# Patient Record
Sex: Male | Born: 1935 | Race: White | Hispanic: No | Marital: Married | Smoking: Never smoker
Health system: Southern US, Community
[De-identification: ages and names within clinical notes are randomized; demographics above are authoritative.]

## PROBLEM LIST (undated history)

## (undated) DIAGNOSIS — A498 Other bacterial infections of unspecified site: Secondary | ICD-10-CM

## (undated) DIAGNOSIS — E785 Hyperlipidemia, unspecified: Secondary | ICD-10-CM

## (undated) DIAGNOSIS — F419 Anxiety disorder, unspecified: Secondary | ICD-10-CM

## (undated) DIAGNOSIS — F32A Depression, unspecified: Secondary | ICD-10-CM

## (undated) DIAGNOSIS — R499 Unspecified voice and resonance disorder: Secondary | ICD-10-CM

## (undated) DIAGNOSIS — K219 Gastro-esophageal reflux disease without esophagitis: Secondary | ICD-10-CM

## (undated) DIAGNOSIS — I6529 Occlusion and stenosis of unspecified carotid artery: Secondary | ICD-10-CM

## (undated) DIAGNOSIS — E039 Hypothyroidism, unspecified: Secondary | ICD-10-CM

## (undated) DIAGNOSIS — N400 Enlarged prostate without lower urinary tract symptoms: Secondary | ICD-10-CM

## (undated) DIAGNOSIS — G4701 Insomnia due to medical condition: Principal | ICD-10-CM

## (undated) DIAGNOSIS — F329 Major depressive disorder, single episode, unspecified: Secondary | ICD-10-CM

## (undated) DIAGNOSIS — R630 Anorexia: Secondary | ICD-10-CM

## (undated) HISTORY — DX: Major depressive disorder, single episode, unspecified: F32.9

## (undated) HISTORY — DX: Anxiety disorder, unspecified: F41.9

## (undated) HISTORY — DX: Hyperlipidemia, unspecified: E78.5

## (undated) HISTORY — DX: Hypothyroidism, unspecified: E03.9

## (undated) HISTORY — DX: Insomnia due to medical condition: G47.01

## (undated) HISTORY — DX: Other bacterial infections of unspecified site: A49.8

## (undated) HISTORY — DX: Occlusion and stenosis of unspecified carotid artery: I65.29

## (undated) HISTORY — DX: Anorexia: R63.0

## (undated) HISTORY — DX: Unspecified voice and resonance disorder: R49.9

## (undated) HISTORY — DX: Benign prostatic hyperplasia without lower urinary tract symptoms: N40.0

## (undated) HISTORY — DX: Depression, unspecified: F32.A

## (undated) HISTORY — DX: Gastro-esophageal reflux disease without esophagitis: K21.9

---
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
## 2001-04-05 ENCOUNTER — Encounter: Admission: RE | Admit: 2001-04-05 | Discharge: 2001-04-05 | Payer: Self-pay | Admitting: Family Medicine

## 2001-04-05 ENCOUNTER — Encounter: Payer: Self-pay | Admitting: Family Medicine

## 2001-04-29 ENCOUNTER — Ambulatory Visit (HOSPITAL_COMMUNITY): Admission: RE | Admit: 2001-04-29 | Discharge: 2001-04-29 | Payer: Self-pay | Admitting: Gastroenterology

## 2006-07-14 HISTORY — PX: CATARACT EXTRACTION: SUR2

## 2006-07-14 HISTORY — PX: OTHER SURGICAL HISTORY: SHX169

## 2007-01-15 ENCOUNTER — Emergency Department (HOSPITAL_COMMUNITY): Admission: EM | Admit: 2007-01-15 | Discharge: 2007-01-15 | Payer: Self-pay | Admitting: Emergency Medicine

## 2007-07-15 HISTORY — PX: EYE SURGERY: SHX253

## 2007-12-25 ENCOUNTER — Emergency Department (HOSPITAL_COMMUNITY): Admission: EM | Admit: 2007-12-25 | Discharge: 2007-12-26 | Payer: Self-pay | Admitting: Emergency Medicine

## 2009-05-11 ENCOUNTER — Encounter: Admission: RE | Admit: 2009-05-11 | Discharge: 2009-05-11 | Payer: Self-pay | Admitting: Family Medicine

## 2009-07-14 HISTORY — PX: WISDOM TOOTH EXTRACTION: SHX21

## 2010-05-10 ENCOUNTER — Encounter: Admission: RE | Admit: 2010-05-10 | Discharge: 2010-05-10 | Payer: Self-pay | Admitting: Emergency Medicine

## 2011-01-06 ENCOUNTER — Other Ambulatory Visit: Payer: Self-pay | Admitting: Emergency Medicine

## 2011-01-06 DIAGNOSIS — G119 Hereditary ataxia, unspecified: Secondary | ICD-10-CM

## 2011-01-13 ENCOUNTER — Ambulatory Visit
Admission: RE | Admit: 2011-01-13 | Discharge: 2011-01-13 | Disposition: A | Discharge status: Home / Self Care | Payer: Medicare Other | Source: Ambulatory Visit | Attending: Emergency Medicine | Admitting: Emergency Medicine

## 2011-01-13 DIAGNOSIS — G119 Hereditary ataxia, unspecified: Secondary | ICD-10-CM

## 2011-01-13 MED ORDER — GADOBENATE DIMEGLUMINE 529 MG/ML IV SOLN
15.0000 mL | Freq: Once | INTRAVENOUS | Status: AC | PRN
  Administered 2011-01-13: 15 mL via INTRAVENOUS

## 2011-02-14 ENCOUNTER — Inpatient Hospital Stay (INDEPENDENT_AMBULATORY_CARE_PROVIDER_SITE_OTHER)
Admission: RE | Admit: 2011-02-14 | Discharge: 2011-02-14 | Disposition: A | Discharge status: Home / Self Care | Payer: Medicare Other | Source: Ambulatory Visit | Attending: Emergency Medicine | Admitting: Emergency Medicine

## 2011-02-14 DIAGNOSIS — T148XXA Other injury of unspecified body region, initial encounter: Secondary | ICD-10-CM

## 2011-04-10 LAB — POCT I-STAT, CHEM 8
BUN: 10
Calcium, Ion: 1.15
Chloride: 108
Creatinine, Ser: 0.9
Glucose, Bld: 117 — ABNORMAL HIGH
HCT: 41
Hemoglobin: 13.9
Potassium: 3.4 — ABNORMAL LOW
Sodium: 138
TCO2: 16

## 2011-04-10 LAB — POCT CARDIAC MARKERS: Operator id: 294511

## 2011-04-29 LAB — I-STAT 8, (EC8 V) (CONVERTED LAB)
BUN: 11
Chloride: 105
Glucose, Bld: 81
HCT: 44
Hemoglobin: 15
Operator id: 272551
Sodium: 138

## 2012-06-18 ENCOUNTER — Encounter: Payer: Self-pay | Admitting: Gastroenterology

## 2012-06-28 ENCOUNTER — Ambulatory Visit (INDEPENDENT_AMBULATORY_CARE_PROVIDER_SITE_OTHER): Payer: Medicare Other | Admitting: Vascular Surgery

## 2012-06-28 DIAGNOSIS — R0989 Other specified symptoms and signs involving the circulatory and respiratory systems: Secondary | ICD-10-CM

## 2012-07-22 ENCOUNTER — Ambulatory Visit (AMBULATORY_SURGERY_CENTER): Payer: Medicare Other | Admitting: *Deleted

## 2012-07-22 VITALS — Ht 68.0 in | Wt 158.6 lb

## 2012-07-22 DIAGNOSIS — Z1211 Encounter for screening for malignant neoplasm of colon: Secondary | ICD-10-CM

## 2012-07-22 MED ORDER — MOVIPREP 100 G PO SOLR: ORAL | Status: DC

## 2012-08-04 ENCOUNTER — Encounter: Payer: Medicare Other | Admitting: Gastroenterology

## 2012-08-26 ENCOUNTER — Encounter: Payer: Medicare Other | Admitting: Gastroenterology

## 2012-09-03 ENCOUNTER — Encounter: Payer: Self-pay | Admitting: Gastroenterology

## 2012-09-03 ENCOUNTER — Ambulatory Visit (AMBULATORY_SURGERY_CENTER): Payer: Medicare Other | Admitting: Gastroenterology

## 2012-09-03 VITALS — BP 125/71 | HR 65 | Temp 97.8°F | Resp 22 | Ht 68.0 in | Wt 158.0 lb

## 2012-09-03 MED ORDER — SODIUM CHLORIDE 0.9 % IV SOLN: 500.0000 mL | INTRAVENOUS | Status: DC

## 2012-09-03 NOTE — Patient Instructions (Addendum)
YOU HAD AN ENDOSCOPIC PROCEDURE TODAY AT THE [facility]: Refer to the procedure report that was given to you for any specific questions about what was found during the examination.  If the procedure report does not answer your questions, please call your gastroenterologist to clarify.  If you requested that your care partner not be given the details of your procedure findings, then the procedure report has been included in a sealed envelope for you to review at your convenience later.  YOU SHOULD EXPECT: Some feelings of bloating in the abdomen. Passage of more gas than usual.  Walking can help get rid of the air that was put into your GI tract during the procedure and reduce the bloating. If you had a lower endoscopy (such as a colonoscopy or flexible sigmoidoscopy) you may notice spotting of blood in your stool or on the toilet paper. If you underwent a bowel prep for your procedure, then you may not have a normal bowel movement for a few days.  DIET: Your first meal following the procedure should be a light meal and then it is ok to progress to your normal diet.  A half-sandwich or bowl of soup is an example of a good first meal.  Heavy or fried foods are harder to digest and may make you feel nauseous or bloated.  Likewise meals heavy in dairy and vegetables can cause extra gas to form and this can also increase the bloating.  Drink plenty of fluids but you should avoid alcoholic beverages for 24 hours.  ACTIVITY: Your care partner should take you home directly after the procedure.  You should plan to take it easy, moving slowly for the rest of the day.  You can resume normal activity the day after the procedure however you should NOT DRIVE or use heavy machinery for 24 hours (because of the sedation medicines used during the test).    SYMPTOMS TO REPORT IMMEDIATELY: A gastroenterologist can be reached at any hour.  During normal business hours, 8:30 AM to 5:00 PM Monday through Friday,  call [phone].  After hours and on weekends, please call the GI answering service at [phone] who will take a message and have the physician on call contact you.   Following lower endoscopy (colonoscopy or flexible sigmoidoscopy):  Excessive amounts of blood in the stool  Significant tenderness or worsening of abdominal pains  Swelling of the abdomen that is new, acute  Fever of 100F or higher  FOLLOW UP:.  Our staff will call the home number listed on your records the next business day following your procedure to check on you and address any questions or concerns that you may have at that time regarding the information given to you following your procedure. This is a courtesy call and so if there is no answer at the home number and we have not heard from you through the emergency physician on call, we will assume that you have returned to your regular daily activities without incident.  SIGNATURES/CONFIDENTIALITY: You and/or your care partner have signed paperwork which will be entered into your electronic medical record.  These signatures attest to the fact that that the information above on your After Visit Summary has been reviewed and is understood.  Full responsibility of the confidentiality of this discharge information lies with you and/or your care-partner.  Please continue your normal medications

## 2012-09-03 NOTE — Progress Notes (Signed)
Patient did not experience any of the following events: a burn prior to discharge; a fall within the facility; wrong site/side/patient/procedure/implant event; or a hospital transfer or hospital admission upon discharge from the facility. (G8907) Patient did not have preoperative order for IV antibiotic SSI prophylaxis. (G8918)  

## 2012-09-03 NOTE — Op Note (Signed)
 [facility] [address] [place], [address]   COLONOSCOPY PROCEDURE REPORT  PATIENT: Sean Burns, Sean Burns  MR#: [id number] BIRTHDATE: 15-Jul-1934 , 78  yrs. old GENDER: Male ENDOSCOPIST: Meryl Dare, MD, [facility] REFERRED XB:JYNWG Duaine Dredge, M.D. PROCEDURE DATE:  09/03/2012 PROCEDURE:   Colonoscopy, screening ASA CLASS:   Class II INDICATIONS:average risk screening. MEDICATIONS: MAC sedation, administered by CRNA and propofol (Diprivan) 230mg  IV DESCRIPTION OF PROCEDURE:   After the risks benefits and alternatives of the procedure were thoroughly explained, informed consent was obtained.  A digital rectal exam revealed no abnormalities of the rectum.   The LB [id number]  endoscope was introduced through the anus and advanced to the cecum, which was identified by both the appendix and ileocecal valve. No adverse events experienced with a tortuous and redundant colon.   The quality of the prep was good, using MoviPrep  The instrument was then slowly withdrawn as the colon was fully examined.  COLON FINDINGS: A normal appearing cecum, ileocecal valve, and appendiceal orifice were identified.  The ascending, hepatic flexure, transverse, splenic flexure, descending, sigmoid colon and rectum appeared unremarkable.  No polyps or cancers were seen. Retroflexed views revealed no abnormalities. The time to cecum=6 minutes 09 seconds.  Withdrawal time=10 minutes 11 seconds.  The scope was withdrawn and the procedure completed.  COMPLICATIONS: There were no complications.  ENDOSCOPIC IMPRESSION: 1.  Normal colon  RECOMMENDATIONS: 1.  Given your age, you will not need another colonoscopy for colon cancer screening or polyp surveillance.  These types of tests usually stop around the age 62.  eSigned:  Meryl Dare, MD, [facility] 09/03/2012 12:15 PM

## 2012-09-06 ENCOUNTER — Telehealth: Payer: Self-pay | Admitting: *Deleted

## 2012-09-06 NOTE — Telephone Encounter (Signed)
Left message on number given in admitting to return call if problems or concerns. ewm

## 2013-07-21 ENCOUNTER — Ambulatory Visit
Admission: RE | Admit: 2013-07-21 | Discharge: 2013-07-21 | Disposition: A | Discharge status: Home / Self Care | Payer: Medicare Other | Source: Ambulatory Visit | Attending: Family Medicine | Admitting: Family Medicine

## 2013-07-21 ENCOUNTER — Other Ambulatory Visit: Payer: Self-pay | Admitting: Family Medicine

## 2013-07-21 DIAGNOSIS — J189 Pneumonia, unspecified organism: Secondary | ICD-10-CM

## 2014-05-05 ENCOUNTER — Ambulatory Visit (HOSPITAL_COMMUNITY)
Admission: RE | Admit: 2014-05-05 | Discharge: 2014-05-05 | Disposition: A | Discharge status: Home / Self Care | Payer: Medicare Other | Source: Ambulatory Visit | Attending: Vascular Surgery | Admitting: Vascular Surgery

## 2014-05-05 ENCOUNTER — Other Ambulatory Visit: Payer: Self-pay | Admitting: Family Medicine

## 2014-05-05 ENCOUNTER — Other Ambulatory Visit (HOSPITAL_COMMUNITY): Payer: Self-pay | Admitting: Family Medicine

## 2014-05-05 ENCOUNTER — Ambulatory Visit
Admission: RE | Admit: 2014-05-05 | Discharge: 2014-05-05 | Disposition: A | Discharge status: Home / Self Care | Payer: Medicare Other | Source: Ambulatory Visit | Attending: Family Medicine | Admitting: Family Medicine

## 2014-05-05 DIAGNOSIS — R634 Abnormal weight loss: Secondary | ICD-10-CM

## 2014-05-05 DIAGNOSIS — R053 Chronic cough: Secondary | ICD-10-CM

## 2014-05-05 DIAGNOSIS — I6523 Occlusion and stenosis of bilateral carotid arteries: Secondary | ICD-10-CM

## 2014-05-05 DIAGNOSIS — R05 Cough: Secondary | ICD-10-CM

## 2014-06-07 ENCOUNTER — Encounter: Payer: Self-pay | Admitting: Gastroenterology

## 2014-06-21 ENCOUNTER — Ambulatory Visit: Payer: Medicare Other | Admitting: Gastroenterology

## 2014-06-22 ENCOUNTER — Ambulatory Visit: Payer: Medicare Other | Admitting: Gastroenterology

## 2014-07-31 ENCOUNTER — Encounter: Payer: Self-pay | Admitting: Neurology

## 2014-07-31 ENCOUNTER — Ambulatory Visit (INDEPENDENT_AMBULATORY_CARE_PROVIDER_SITE_OTHER): Payer: Medicare Other | Admitting: Neurology

## 2014-07-31 VITALS — BP 115/69 | HR 96 | Resp 28 | Ht 69.5 in | Wt 154.0 lb

## 2014-07-31 DIAGNOSIS — R351 Nocturia: Secondary | ICD-10-CM

## 2014-07-31 DIAGNOSIS — N401 Enlarged prostate with lower urinary tract symptoms: Secondary | ICD-10-CM | POA: Insufficient documentation

## 2014-07-31 DIAGNOSIS — F324 Major depressive disorder, single episode, in partial remission: Secondary | ICD-10-CM

## 2014-07-31 DIAGNOSIS — G4701 Insomnia due to medical condition: Secondary | ICD-10-CM | POA: Insufficient documentation

## 2014-07-31 DIAGNOSIS — F5105 Insomnia due to other mental disorder: Secondary | ICD-10-CM

## 2014-07-31 DIAGNOSIS — F341 Dysthymic disorder: Secondary | ICD-10-CM

## 2014-07-31 DIAGNOSIS — F418 Other specified anxiety disorders: Secondary | ICD-10-CM | POA: Insufficient documentation

## 2014-07-31 HISTORY — DX: Insomnia due to medical condition: G47.01

## 2014-07-31 MED ORDER — SUVOREXANT 15 MG PO TABS: 15.0000 mg | ORAL_TABLET | Freq: Every evening | ORAL | Status: DC

## 2014-07-31 NOTE — Patient Instructions (Addendum)
Insomnia Insomnia is frequent trouble falling and/or staying asleep. Insomnia can be a long term problem or a short term problem. Both are common. Insomnia can be a short term problem when the wakefulness is related to a certain stress or worry. Long term insomnia is often related to ongoing stress during waking hours and/or poor sleeping habits. Overtime, sleep deprivation itself can make the problem worse. Every little thing feels more severe because you are overtired and your ability to cope is decreased. CAUSES   Stress, anxiety, and depression.  Poor sleeping habits.  Distractions such as TV in the bedroom.  Naps close to bedtime.  Engaging in emotionally charged conversations before bed.  Technical reading before sleep.  Alcohol and other sedatives. They may make the problem worse. They can hurt normal sleep patterns and normal dream activity.  Stimulants such as caffeine for several hours prior to bedtime.  Pain syndromes and shortness of breath can cause insomnia.  Exercise late at night.  Changing time zones may cause sleeping problems (jet lag). It is sometimes helpful to have someone observe your sleeping patterns. They should look for periods of not breathing during the night (sleep apnea). They should also look to see how long those periods last. If you live alone or observers are uncertain, you can also be observed at a sleep clinic where your sleep patterns will be professionally monitored. Sleep apnea requires a checkup and treatment. Give your caregivers your medical history. Give your caregivers observations your family has made about your sleep.  SYMPTOMS   Not feeling rested in the morning.  Anxiety and restlessness at bedtime.  Difficulty falling and staying asleep. TREATMENT   Your caregiver may prescribe treatment for an underlying medical disorders. Your caregiver can give advice or help if you are using alcohol or other drugs for self-medication. Treatment  of underlying problems will usually eliminate insomnia problems.  Medications can be prescribed for short time use. They are generally not recommended for lengthy use.  Over-the-counter sleep medicines are not recommended for lengthy use. They can be habit forming.  You can promote easier sleeping by making lifestyle changes such as:  Using relaxation techniques that help with breathing and reduce muscle tension.  Exercising earlier in the day.  Changing your diet and the time of your last meal. No night time snacks.  Establish a regular time to go to bed.  Counseling can help with stressful problems and worry.  Soothing music and white noise may be helpful if there are background noises you cannot remove.  Stop tedious detailed work at least one hour before bedtime. HOME CARE INSTRUCTIONS   Keep a diary. Inform your caregiver about your progress. This includes any medication side effects. See your caregiver regularly. Take note of:  Times when you are asleep.  Times when you are awake during the night.  The quality of your sleep.  How you feel the next day. This information will help your caregiver care for you.  Get out of bed if you are still awake after 15 minutes. Read or do some quiet activity. Keep the lights down. Wait until you feel sleepy and go back to bed.  Keep regular sleeping and waking hours. Avoid naps.  Exercise regularly.  Avoid distractions at bedtime. Distractions include watching television or engaging in any intense or detailed activity like attempting to balance the household checkbook.  Develop a bedtime ritual. Keep a familiar routine of bathing, brushing your teeth, climbing into bed at the same  time each night, listening to soothing music. Routines increase the success of falling to sleep faster.  Use relaxation techniques. This can be using breathing and muscle tension release routines. It can also include visualizing peaceful scenes. You can  also help control troubling or intruding thoughts by keeping your mind occupied with boring or repetitive thoughts like the old concept of counting sheep. You can make it more creative like imagining planting one beautiful flower after another in your backyard garden.  During your day, work to eliminate stress. When this is not possible use some of the previous suggestions to help reduce the anxiety that accompanies stressful situations. MAKE SURE YOU:   Understand these instructions.  Will watch your condition.  Will get help right away if you are not doing well or get worse. Document Released: 06/27/2000 Document Revised: 09/22/2011 Document Reviewed: 07/28/2007 [facility] Patient Information 2015 [place], [place]. This information is not intended to replace advice given to you by your health care provider. Make sure you discuss any questions you have with your health care provider.   Reduce trazodone by 50% which means a half tablet for the last for the next 8 days. You may start Belsomra at the same time if Belsomra with the lower dose of trazodone allows you a sound sleep you may switch completely away from trazodone and take Belsomra only. If Belsomra does not maintain sleep you can stop taking it after the 10 day per trial.. In addition I would recommend to use 5 mg of melatonin at night as it can make sleep interruptions met less frequent and prolonged with sleep interval times. We will meet again in about 2 months to review how you sleep has evolved and if you would need a sleep study or not at this time.

## 2014-07-31 NOTE — Progress Notes (Signed)
SLEEP MEDICINE CLINIC   Provider:  Melvyn Novas, M D  Referring Provider: Carolyne Fiscal, MD Primary Care Physician:  Carolyne Fiscal, MD  Chief Complaint  Patient presents with  . NP Ataxia, sleep/ cognitive   (blomgren)    Rm 11, alone    HPI:  Sean Burns is a 80 y.o. male seen here as a referral  from Dr. Duaine Dredge for evaluation of insomnia and cognitive impairment, and the possible relation to depression and medication.  Mr. Capozzi, a Caucasian gentleman, right-handed is seen here today for the evaluation of the above chief complaints. I have visit received a very kind referral letter from Dr. Duaine Dredge as well as a short letter from the patient's spouse regarding 2 concerns about underlying conditions affecting his sleep as well as medication and medication side effect concerns. I would like to states here in short that the patient has noticed some recent minor memory changes he feels that to so-called confusional episodes or not of major importance to his day-to-day life and rather on a total period he has long-standing history of depression as well as insomnia and was treated with trazodone and alprazolam.  These medications helped him to get more restful sleep at night and an increase in fluoxetine from 20-40 mg in addition is perceived by the patient as having lifted him further out of a depression.   In today's review of systems he endorsed feeling often hot and cold, coughing at night, some anxiety some change in appetite mild tremor at rest confusion insomnia hearing loss palpitations and weight loss. The patient and his family both related a episode of Clostridium difficile infection in 2015 as well as a bilateral pneumonia in 2014 as periods of physical decline and some cognitive changes. He became very fatigued, lost weight and was tremolus. He became hoarse and he still has a change in appetite. He was negative for H Pylori and HIV .  He has seen Dr. Lazarus Salines , who  performed a sinus CT and suggested an up dosing in GERD medication. This  Was done, and has not changed the hoarseness. He feels not as sharp as he used to be.  He is however feeling better on higher doses of Prozac than on lower and his sleep is improved . He has no choking episodes, no dysphagia. No known deficiencies in Vitamins or minerals. He has a mild anemia.   The patient goes to bed around 10 PM, retreats to a bedroom that is described as core, quiet and dark. Has 3 times nocturia, which wakes him. He used tamsolusin.  He describes his sleep as interrupted as it is fragmented by bathroom breaks, it may take him up to half an hour to fall asleep again after bathroom break and he rises finally at about 7 AM in the morning. Usually he wakes spontaneously at the time. 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 He does drink rarely coffee now, until 6 month ago  he would have had 2 cups a day. He feels coffee doesn't taste good anymore.  He has breakfast at home, he leaves the house every day, is exposed to daytime sunlight, he walks daily.  He exercises otherwise irregularly. He does not take daytime naps. He  watch TV at night , not in the bedroom. He has been told that he has been snoring on occasion , but not apnea.   The patient has had no sinus, neck or airway surgery, he had wisdom tooth removed, tonsillectomy in childhood at age 85.  No TBI history .  No family history of insomnia, apnea or parasomnia. He has not been a tight terror or sleep walker.     Review of Systems: Out of a complete 14 system review, the patient complains of only the following symptoms, and all other reviewed systems are negative. MOCA 26-30 , this patient scored in the normal range for a college graduate and for his age  group. He was able to do the serial sevens he missed 2 out of 5 recall words which is an average for any patient over 70. He was fully oriented short normal attention span could repeat complicated sentences correctly and was able to abstract he would name objects including 3 animals was able to draw a clock face placed hands and And numbers correctly and to draw a three-dimensional cube. He failed  the trail making test.  Epworth score 0  , Fatigue severity score 17   ,  Geriatric depression score 3 points    History   Social History  . Marital Status: Divorced    Spouse Name: N/A    Number of Children: N/A  . Years of Education: N/A   Occupational History  . Not on file.   Social History Main Topics  . Smoking status: Never Smoker   . Smokeless tobacco: Never Used  . Alcohol Use: Yes     Comment: rare  . Drug Use: No  . Sexual Activity: Not on file   Other Topics Concern  . Not on file   Social History Narrative   Caffeine 1-2 cup monthly.  Retired.  Married 2 kids.      Family History  Problem Relation Age of Onset  . Colon cancer Neg Hx   . Stomach cancer Neg Hx   . Hypertension Mother     Past Medical History  Diagnosis Date  . Anxiety   . Hyperlipidemia   . GERD (gastroesophageal reflux disease)   . Hypothyroidism   . Depression   . Anorexia   . BPH (benign prostatic hypertrophy)   . Change in voice   . Loss of appetite   . Insomnia due to medical condition 07/31/2014  . BPH associated with nocturia 07/31/2014  . Insomnia secondary to depression with anxiety 07/31/2014    Past Surgical History  Procedure Laterality Date  . Cataract extraction  2008    bilateral  . Wisdom tooth extraction  2011  . Tonsillectomy  1942  . Torn retina  2008    left  . Eye surgery  2009    both eyes; laser for eye pressure    Current Outpatient Prescriptions  Medication Sig Dispense Refill  . ALPRAZolam (XANAX) 0.5 MG tablet Take 0.5 mg by mouth at bedtime as needed.      Marland Kitchen aspirin 81 MG tablet Take 81 mg by mouth daily.    Marland Kitchen atorvastatin (LIPITOR) 40 MG tablet Take 40 mg by mouth daily.    . Cholecalciferol (VITAMIN D-3 PO) Take 2,000 Units by mouth daily.     Marland Kitchen FLUoxetine (PROZAC) 40 MG capsule Take 40 mg by mouth daily.    Marland Kitchen levothyroxine (SYNTHROID, LEVOTHROID) 100 MCG tablet Take 100 mcg by mouth daily before breakfast.    .  mupirocin ointment (BACTROBAN) 2 %   0  . omeprazole (PRILOSEC) 20 MG capsule Take 20 mg by mouth daily.    . Tamsulosin HCl (FLOMAX) 0.4 MG CAPS 0.4 mg daily after supper.     . traZODone (DESYREL) 50 MG tablet Take 50 mg by mouth at bedtime. Take a half tablet at bedtime po. After 8 days , try to d/c  Trazodone .   3  . Suvorexant (BELSOMRA) 15 MG TABS Take 15 mg by mouth Nightly. 10 tablet 0   No current facility-administered medications for this visit.    Allergies as of 07/31/2014 - Review Complete 07/31/2014  Allergen Reaction Noted  . Feldene [piroxicam] Hives 07/22/2012    Vitals: BP 115/69 mmHg  Pulse 96  Resp 28  Ht 5' 9.5" (1.765 m)  Wt 154 lb (69.854 kg)  BMI 22.42 kg/m2 Last Weight:  Wt Readings from Last 1 Encounters:  07/31/14 154 lb (69.854 kg)       Last Height:   Ht Readings from Last 1 Encounters:  07/31/14 5' 9.5" (1.765 m)    Physical exam:  General: The patient is awake, alert and appears not in acute distress. The patient is well groomed. Head: Normocephalic, atraumatic. Neck is supple. Mallampati 2-3 , red palate . neck circumference: 15.5  . Nasal airflow unrestricted  TMJ is not evident . Retrognathia is not seen. No dentures .  Cardiovascular:  Regular rate and rhythm , without  murmurs or carotid bruit, and without distended neck veins. Respiratory: Lungs are clear to auscultation. Skin:  Without evidence of edema, or rash Trunk: BMI is  elevated and patient  has normal posture.  Neurologic exam : The patient is awake and alert, oriented to place and time.   Memory subjective  described as intact.  There is a normal attention span & concentration ability. Speech is fluent without dysarthria, dysphonia or aphasia. Mood and affect are appropriate.  Cranial nerves: Pupils are equal and briskly reactive to light.  Funduscopic exam without  evidence of pallor or edema. Extraocular movements  in vertical and horizontal planes intact and without nystagmus. The patient is status post cataract surgery in both eyes. Visual fields by finger perimetry are intact. Hearing to finger rub intact. The patient reports that he has had some hearing loss, but there is no discrepancy between air conduction and bone conduction.  Facial sensation intact to fine touch. Facial motor strength is symmetric and tongue and uvula move midline. Tongue without tremor.   Motor exam:   Normal tone ,muscle bulk and symmetric ,strength in all extremities. There is no evidence of cogwheeling or rigidity. Normal relaxation.  Sensory:  Fine touch, pinprick and vibration were tested in all extremities. Proprioception is normal. Vibration is reduced over the right ankle- medial and lateral.   Coordination: Rapid alternating movements in the fingers/hands is normal. Finger-to-nose maneuver normal without evidence of ataxia, dysmetria . The patient has a mild low amplitude bilateral hand tremor with outstretched arms. There is no pill-rolling noticed and the amplitude is not parkinsonian.  Gait and station: Patient walks without assistive device and is able unassisted to climb up to the exam table.  He is moving slower, he feels unstable. He feels this is new , onset about 1 year ago.  Strength within normal limits. Stance is stable and normal. Tandem gait is fragmented. Romberg testing is negative. There is no shuffling gait noticed. The patient was able unassisted to raise from a seated position climb up  to the exam table and return. He turns with 3 steps  Deep tendon reflexes: in the  upper and lower  extremities are  Brisk , 2 plus , especially brisk in patella and achilles tendon.--symmetric.  Babinski maneuver response is downgoing.   Assessment:  After physical and neurologic examination, review of laboratory studies, imaging, neurophysiology testing and pre-existing records, assessment is   0) insomnia with sleep fragmentation. No excessive daytime sleepiness, no snoring. Stay on fluoxetine.  1) nocturia fragmentation of sleep , attributed to  BPH. I have little evidence that this patient has apnea based on his low body mass index is rather slender, he has a normal muscle tone, he only wakes up with a dry mouth which could be medication induced. What I would like to check is if there is any presence of central sleep apnea which would not be related to snoring and often patients gasp for air not being aware of it and arouse from deeper stages of sleep to lighter stages of sleep but not necessarily to full wakefulness. i will offer a trial of belsomra.  2) depression in clinical remission. 3) essential , non parkinsonian tremor.  4) cognitive testing indicated normal range for age.     The patient was advised of the nature of the diagnosed sleep disorder , the treatment options and risks for general a health and wellness arising from not treating the condition. Visit duration was 45 minutes.  The patient appears to [facility] from [facility] as an antidepressant medication and had previously tried Celexa or Lexapro. I think there are good reasons to continue the Prozac also it may induce a mild tremor and may induce excessive yawning in some patients which can be interpreted as excessive daytime sleepiness even that the symptoms symptom and the true sense of the world is not present. The patient endorsed no sleepiness and daytime which I would expect if he would have an hangover effect from trazodone or benzodiazepine. Based that he feels he sleeps better on these medications I would leave it up to  him to decide if he should or should not continue with a sleep aid. There is a new but patent branded medication on the market called Belsomra . Several of my patients that have not been on true sleeping pills in the past have done well with that but it is excessively expensive in comparison to established medications that may give the same safety profile.  Trazodone can impair cognitive functions , as  can long-term use of benzodiazepines.   Plan:  Treatment plan and additional workup :  depression induced insomnia,  Some underlying anxiety.   Belsomra 10 mg trial. If this medication causes a groggy effect in AM or is simply not effective, change back to trazodone and add melatonin, 5 or 10 mg at bedtime.  Sleep study not needed at this time . Nocturia treatment with PCP or urology. Try to avoid anticholinergic medications.  Reduce the trazodone by 505 for one week and start taking melatonin, if this is successful reduce to D/c trazodone. You may take Belsomra any time in this process.     Porfirio Mylararmen Leeanna Slaby MD  07/31/2014

## 2014-08-08 ENCOUNTER — Encounter: Payer: Self-pay | Admitting: Gastroenterology

## 2014-08-08 ENCOUNTER — Other Ambulatory Visit: Payer: Medicare Other

## 2014-08-08 ENCOUNTER — Ambulatory Visit: Payer: Medicare Other | Admitting: Gastroenterology

## 2014-08-08 ENCOUNTER — Ambulatory Visit (INDEPENDENT_AMBULATORY_CARE_PROVIDER_SITE_OTHER): Payer: Medicare Other | Admitting: Gastroenterology

## 2014-08-08 VITALS — BP 120/62 | HR 84 | Ht 68.25 in | Wt 148.0 lb

## 2014-08-08 DIAGNOSIS — R194 Change in bowel habit: Secondary | ICD-10-CM

## 2014-08-08 DIAGNOSIS — R634 Abnormal weight loss: Secondary | ICD-10-CM

## 2014-08-08 DIAGNOSIS — R490 Dysphonia: Secondary | ICD-10-CM

## 2014-08-08 MED ORDER — OMEPRAZOLE 20 MG PO CPDR: 20.0000 mg | DELAYED_RELEASE_CAPSULE | Freq: Two times a day (BID) | ORAL | Status: DC

## 2014-08-08 NOTE — Progress Notes (Signed)
History of Present Illness: This is a 80 year old male referred by Dr. Duaine DredgeBlomgren for evaluation of weight loss and hoarseness. I have reviewed extensive records sent by Dr. Duaine DredgeBlomgren. Patient has had about a 10 pound weight loss and decreased appetite over the several months. He was treated with a Z-Pak for pneumonia and developed C. difficile which was treated with a ten-day course of metronidazole. He has noted changes in his stool with some white particles and he is concerned these are parasites. He has had worsening problems with hoarseness and was evaluated by Dr. Cloria SpringWoliki. He was advised to increase his omeprazole to twice daily but the patient has not done so. Concerned about taking too much medication. Stool Hemoccults were negative. Recent CT of the abdomen/pelvis was unremarkable except for constipation. Colonoscopy performed in February 2014 was entirely normal. Blood work recently obtained by Dr. Duaine DredgeBlomgren has been completely unremarkable except for a mild normocytic anemia with a hemoglobin of 12.7-12.9. Denies abdominal pain, constipation, diarrhea, change in stool caliber, melena, hematochezia, nausea, vomiting, dysphagia, reflux symptoms, chest pain.   Allergies  Allergen Reactions  . Feldene [Piroxicam] Hives   Outpatient Prescriptions Prior to Visit  Medication Sig Dispense Refill  . ALPRAZolam (XANAX) 0.5 MG tablet Take 0.5 mg by mouth at bedtime as needed.     Marland Kitchen. aspirin 81 MG tablet Take 81 mg by mouth daily.    Marland Kitchen. atorvastatin (LIPITOR) 40 MG tablet Take 40 mg by mouth daily.    . Cholecalciferol (VITAMIN D-3 PO) Take 2,000 Units by mouth daily.     Marland Kitchen. FLUoxetine (PROZAC) 40 MG capsule Take 40 mg by mouth daily.    Marland Kitchen. levothyroxine (SYNTHROID, LEVOTHROID) 100 MCG tablet Take 100 mcg by mouth daily before breakfast.    . mupirocin ointment (BACTROBAN) 2 %   0  . omeprazole (PRILOSEC) 20 MG capsule Take 20 mg by mouth daily.    . Tamsulosin HCl (FLOMAX) 0.4 MG CAPS 0.4 mg daily  after supper.     . traZODone (DESYREL) 50 MG tablet Take 50 mg by mouth at bedtime. Take a half tablet at bedtime po. After 8 days , try to d/c  Trazodone .   3  . Suvorexant (BELSOMRA) 15 MG TABS Take 15 mg by mouth Nightly. (Patient not taking: Reported on 08/08/2014) 10 tablet 0   No facility-administered medications prior to visit.   Past Medical History  Diagnosis Date  . Anxiety   . Hyperlipidemia   . GERD (gastroesophageal reflux disease)   . Hypothyroidism   . Depression   . Anorexia   . BPH (benign prostatic hypertrophy)   . Change in voice   . Loss of appetite   . Insomnia due to medical condition 07/31/2014  . BPH associated with nocturia 07/31/2014  . Insomnia secondary to depression with anxiety 07/31/2014  . Clostridium difficile infection    Past Surgical History  Procedure Laterality Date  . Cataract extraction Bilateral 2008  . Wisdom tooth extraction  2011  . Tonsillectomy  1942  . Torn retina Left 2008    left  . Eye surgery  2009    both eyes; laser for eye pressure   History   Social History  . Marital Status: Divorced    Spouse Name: N/A    Number of Children: 2  . Years of Education: N/A   Occupational History  . retired    Social History Main Topics  . Smoking status: Never Smoker   . Smokeless  tobacco: Never Used  . Alcohol Use: No  . Drug Use: No  . Sexual Activity: None   Other Topics Concern  . None   Social History Narrative   Caffeine 1-2 cup monthly.  Retired.  Married 2 kids.     Family History  Problem Relation Age of Onset  . Colon cancer Neg Hx   . Hypertension Mother   . Stroke Mother 77  . Heart attack Father 26  . Stomach cancer Maternal Grandmother     ?      Review of Systems: Pertinent positive and negative review of systems were noted in the above HPI section. All other review of systems were otherwise negative.   Physical Exam: General: Well developed , well nourished, no acute distress Head: Normocephalic  and atraumatic Eyes:  sclerae anicteric, EOMI Ears: Normal auditory acuity Mouth: No deformity or lesions Neck: Supple, no masses or thyromegaly Lungs: Clear throughout to auscultation Heart: Regular rate and rhythm; no murmurs, rubs or bruits Abdomen: Soft, non tender and non distended. No masses, hepatosplenomegaly or hernias noted. Normal Bowel sounds Musculoskeletal: Symmetrical with no gross deformities  Skin: No lesions on visible extremities Pulses:  Normal pulses noted Extremities: No clubbing, cyanosis, edema or deformities noted Neurological: Alert oriented x 4, grossly nonfocal Cervical Nodes:  No significant cervical adenopathy Inguinal Nodes: No significant inguinal adenopathy Psychological:  Alert and cooperative. Depessed affect   Assessment and Recommendations:  1. Weight loss, decreased appetite. Rule out ulcer, esophagitis, UGI tract neoplasm. Symptoms most likely related to 4. Check fecal elastase to R/O pancreatic insufficiency and EGD to R/O ulcer, esophagitis upper gastrointestinal tract neoplasm. The risks, benefits, and alternatives to endoscopy with possible biopsy and possible dilation were discussed with the patient and they consent to proceed.   2. Hoarseness. No other symptoms to suggest GERD. He is advised to increase omeprazole to 20 mg po bid as recommend by ENT however he declines as he is reluctant to take too much medication. His thought process does not appear to be completely reasonable. EGD to further evaluate. Follow up with ENT.  3. Change in stool. Suspect he is noting some mucus in his stool. Rule out relapse of C. difficile. GI pathogen panel. O&P.   4. Anxiety/depression. Likely cause of weight loss, anorexia and concern of parasites. Follow-up with Dr. Duaine Dredge and his behavioral health specialist.

## 2014-08-08 NOTE — Patient Instructions (Addendum)
Your physician has requested that you go to the basement for lab work before leaving today.  You have been scheduled for an endoscopy. Please follow written instructions given to you at your visit today. If you use inhalers (even only as needed), please bring them with you on the day of your procedure. Your physician has requested that you go to www.startemmi.com and enter the access code given to you at your visit today. This web site gives a general overview about your procedure. However, you should still follow specific instructions given to you by our office regarding your preparation for the procedure.  Per your request to cancel the prescription for omeprazole to increase to twice daily.   Thank you for choosing me and [facility].  Venita LickMalcolm T. Pleas KochStark, Jr., MD., Clementeen GrahamFACG  cc: Mosetta PuttPeter Blomgren, MD

## 2014-08-09 ENCOUNTER — Other Ambulatory Visit: Payer: Medicare Other

## 2014-08-09 DIAGNOSIS — R194 Change in bowel habit: Secondary | ICD-10-CM

## 2014-08-09 DIAGNOSIS — R490 Dysphonia: Secondary | ICD-10-CM

## 2014-08-09 DIAGNOSIS — R634 Abnormal weight loss: Secondary | ICD-10-CM

## 2014-08-10 LAB — GASTROINTESTINAL PATHOGEN PANEL PCR
C. difficile Tox A/B, PCR: NEGATIVE
CRYPTOSPORIDIUM, PCR: NEGATIVE
Campylobacter, PCR: NEGATIVE
E COLI (STEC) STX1/STX2, PCR: NEGATIVE
E coli (ETEC) LT/ST PCR: NEGATIVE
E coli 0157, PCR: NEGATIVE
Giardia lamblia, PCR: NEGATIVE
NOROVIRUS, PCR: NEGATIVE
Rotavirus A, PCR: NEGATIVE
SALMONELLA, PCR: NEGATIVE
Shigella, PCR: NEGATIVE

## 2014-08-11 ENCOUNTER — Telehealth: Payer: Self-pay | Admitting: Cardiology

## 2014-08-11 NOTE — Telephone Encounter (Signed)
[facility]Dr Welton FlakesPeter Blomgren-for appointment on 08/15/14 with Dr Antoine PocheHochrein.  [facility] for Dr Hochrein's schedule on 08/15/14.

## 2014-08-14 ENCOUNTER — Telehealth: Payer: Self-pay | Admitting: Gastroenterology

## 2014-08-14 NOTE — Telephone Encounter (Signed)
Patient notified of results.

## 2014-08-15 ENCOUNTER — Other Ambulatory Visit: Payer: Medicare Other

## 2014-08-15 ENCOUNTER — Other Ambulatory Visit: Payer: Self-pay

## 2014-08-15 ENCOUNTER — Ambulatory Visit (INDEPENDENT_AMBULATORY_CARE_PROVIDER_SITE_OTHER): Payer: Medicare Other | Admitting: Cardiology

## 2014-08-15 ENCOUNTER — Encounter: Payer: Self-pay | Admitting: Cardiology

## 2014-08-15 VITALS — BP 142/80 | HR 90 | Ht 69.0 in | Wt 146.0 lb

## 2014-08-15 DIAGNOSIS — R194 Change in bowel habit: Secondary | ICD-10-CM

## 2014-08-15 DIAGNOSIS — R634 Abnormal weight loss: Secondary | ICD-10-CM

## 2014-08-15 DIAGNOSIS — R9431 Abnormal electrocardiogram [ECG] [EKG]: Secondary | ICD-10-CM

## 2014-08-15 NOTE — Progress Notes (Signed)
Cardiology Office Note   Date:  08/15/2014   ID:  Sean Burns, DOB 1934-09-03, MRN [id number]  PCP:  Carolyne Fiscal, MD  Cardiologist:   Rollene Rotunda, MD   No chief complaint on file.     History of Present Illness: Sean Burns is a 80 y.o. male who presents for  Evaluation of an abnormal EKG. He was noted on recent routine physical T-wave inversion in the high lateral leads. The patient has had no cardiac history. He does report a stress test years ago. He walks routinely every week. With his level of activity he denies any cardiovascular symptoms. The patient denies any new symptoms such as chest discomfort, neck or arm discomfort. There has been no new shortness of breath, PND or orthopnea. There have been no reported palpitations, presyncope or syncope.    Past Medical History  Diagnosis Date  . Anxiety   . Hyperlipidemia   . GERD (gastroesophageal reflux disease)   . Hypothyroidism   . Depression   . BPH (benign prostatic hypertrophy)   . Change in voice   . Loss of appetite   . Insomnia due to medical condition 07/31/2014  . Clostridium difficile infection   . Carotid stenosis     Mild    Past Surgical History  Procedure Laterality Date  . Cataract extraction Bilateral 2008  . Wisdom tooth extraction  2011  . Tonsillectomy  1942  . Torn retina Left 2008    left  . Eye surgery  2009    both eyes; laser for eye pressure     Current Outpatient Prescriptions  Medication Sig Dispense Refill  . ALPRAZolam (XANAX) 0.5 MG tablet Take 0.5 mg by mouth at bedtime as needed.     Marland Kitchen aspirin 81 MG tablet Take 81 mg by mouth daily.    Marland Kitchen atorvastatin (LIPITOR) 40 MG tablet Take 40 mg by mouth daily.    . Cholecalciferol (VITAMIN D-3 PO) Take 2,000 Units by mouth daily.     Marland Kitchen FLUoxetine (PROZAC) 40 MG capsule Take 40 mg by mouth daily.    Marland Kitchen levothyroxine (SYNTHROID, LEVOTHROID) 100 MCG tablet Take 100 mcg by mouth daily before breakfast.    . Melatonin 5 MG TABS Take  5 mg by mouth at bedtime.    . mupirocin ointment (BACTROBAN) 2 %   0  . omeprazole (PRILOSEC) 20 MG capsule Take 20 mg by mouth daily.     . Tamsulosin HCl (FLOMAX) 0.4 MG CAPS 0.4 mg daily after supper.     . traZODone (DESYREL) 50 MG tablet Take 50 mg by mouth at bedtime. Take a half tablet at bedtime po. After 8 days , try to d/c  Trazodone .   3  . Suvorexant (BELSOMRA) 15 MG TABS Take 15 mg by mouth Nightly. 10 tablet 0   No current facility-administered medications for this visit.    Allergies:   Feldene    Social History:  The patient  reports that he has never smoked. He has never used smokeless tobacco. He reports that he does not drink alcohol or use illicit drugs.   Family History:  The patient's family history includes Heart attack (age of onset: 75) in his father; Hypertension in his mother; Stomach cancer in his maternal grandmother; Stroke (age of onset: 9) in his mother. There is no history of Colon cancer.    ROS:  Please see the history of present illness.   Otherwise, review of systems are positive for  Loss  of appetite , cramps, trace leg swelling..   All other systems are reviewed and negative.    PHYSICAL EXAM: VS:  BP 142/80 mmHg  Pulse 90  Ht 5\' 9"  (1.753 m)  Wt 146 lb (66.225 kg)  BMI 21.55 kg/m2 , BMI Body mass index is 21.55 kg/(m^2). GEN: Well nourished, well developed, in no acute distress HEENT: normal Neck: no JVD, carotid bruits, or masses Cardiac: RRR; no murmurs, rubs, or gallops,no edema  Respiratory:  clear to auscultation bilaterally, normal work of breathing GI: soft, nontender, nondistended, + BS MS: no deformity or atrophy Skin: warm and dry, no rash Neuro:  Strength and sensation are intact Psych: euthymic mood, full affect   EKG:  EKG is not ordered today.  I did review an EKG problem 07/04/2014. This demonstrated normal sinus rhythm, rate 75, axis within normal limits, poor anterior R-wave progression, nonspecific T-wave inversion  lead 1 and aVL.      Wt Readings from Last 3 Encounters:  08/15/14 146 lb (66.225 kg)  08/08/14 148 lb (67.132 kg)  07/31/14 154 lb (69.854 kg)      Other studies Reviewed: Additional studies/ records that were reviewed today include: Outside records and labs and EKG. Review of the above records demonstrates: As recorded elsewhere   ASSESSMENT AND PLAN:  ABNORMAL EKG:   I think the possibility  Pretest probability of obstructive coronary disease is low. I will bring the patient back for a POET (Plain Old Exercise Test). This will allow me to screen for obstructive coronary disease, risk stratify and very importantly provide a prescription for exercise.  DYSLIPIDEMIA:   I did review outside labs area his LDL most recently was 60 in December. No change in therapy is indicated.  CAROTID STENOSIS:   He  Reports having this followed routinely and it is mild. I do see bilateral 40% stenosis noted on a recent study. He will continue with risk reduction.  Current medicines are reviewed at length with the patient today.  The patient does not have concerns regarding medicines.  The following changes have been made:  no change  Labs/ tests ordered today include: I will bring the patient back for a POET (Plain Old Exercise Test). This will allow me to screen for obstructive coronary disease, risk stratify and very importantly provide a prescription for exercise.  No orders of the defined types were placed in this encounter.     Disposition:   FU with me as needed   Signed, Rollene RotundaJames Elyjah Hazan, MD  08/15/2014 4:13 PM    [facility]

## 2014-08-15 NOTE — Patient Instructions (Signed)
Your physician recommends that you schedule a follow-up appointment in: one year with Dr. Hochrein  We are ordering a stress test for you to get done  

## 2014-08-16 ENCOUNTER — Other Ambulatory Visit: Payer: Medicare Other

## 2014-08-16 ENCOUNTER — Ambulatory Visit: Payer: Medicare Other | Admitting: Cardiology

## 2014-08-16 DIAGNOSIS — R194 Change in bowel habit: Secondary | ICD-10-CM

## 2014-08-16 DIAGNOSIS — R634 Abnormal weight loss: Secondary | ICD-10-CM

## 2014-08-17 LAB — OVA AND PARASITE EXAMINATION: OP: NONE SEEN

## 2014-08-21 ENCOUNTER — Encounter: Payer: Self-pay | Admitting: Gastroenterology

## 2014-08-21 ENCOUNTER — Telehealth: Payer: Self-pay | Admitting: Cardiology

## 2014-08-21 ENCOUNTER — Telehealth: Payer: Self-pay | Admitting: Gastroenterology

## 2014-08-21 NOTE — Telephone Encounter (Signed)
Reschedule EGD after stress test and cleared by cardiology to proceed

## 2014-08-21 NOTE — Telephone Encounter (Signed)
Patient notified.  He will call back after he has clearance from cardiology to reschedule.

## 2014-08-21 NOTE — Telephone Encounter (Signed)
Pt states seen last Tuesday following abnormal ECG, neglected to mention in OV that he has had palpitations.  Describes as "annoying pulse or missed beat" in the center of his chest. "Always in the same place", he denies pain w/ this.  Typically occurs 2-3 times a day, w/ brief duration.  He mentions he is scheduled for stress test 3/3. Also mentions that he'd been scheduled for an endoscopy, but they cancelled it when he mentioned his symptoms to the scheduling nurse.  I advised I would route to Dr. Antoine PocheHochrein to see if monitor or other intervention was suggested.

## 2014-08-21 NOTE — Telephone Encounter (Signed)
Pt is scheduled for Endo on Wednesday. Pt is concerned about this annoying feeling in his chest,please call to advise..Marland Kitchen

## 2014-08-21 NOTE — Telephone Encounter (Signed)
Med list updated at the patient's request. Dr. Russella DarStark he reports recent heart arrhythmia/atrial fib.  He just saw Dr. Antoine PocheHochrein on 08/15/14.  Ok to proceed with procedure on Wed?

## 2014-08-21 NOTE — Telephone Encounter (Signed)
Patient reports that he has a "feeling in his chest " several times a day.  He has never been diagnosed with a-fib.  Dr. Comanche LionsHochrien is doing a stress test on him 09/14/14 for an abnormal EKG in [place] .  He did not mention these feelings to Dr. Antoine PocheHochrein at his recent office appt on 08/15/14.  Dr. Russella DarStark in the my chart message he sent he said he was having a-fib, he is just assuming that is what the feeling is.  He is instructed to call Dr. Jenene SlickerHochrein's office today.  Dr. Russella DarStark is it ok for him to proceed with EGD on Wed or does he need to reschedule after stress test?

## 2014-08-22 ENCOUNTER — Telehealth: Payer: Self-pay | Admitting: Cardiology

## 2014-08-22 NOTE — Telephone Encounter (Signed)
Noted Dr. Jenene SlickerHochrein's reply on yesterday's call, let patient know what plan is. Pt still mildly anxious & has questions, told him I would let JC know he wanted to speak w/ him.  Routing to [employer]

## 2014-08-22 NOTE — Telephone Encounter (Signed)
Pt is waiting to hear what Dr [place] LionsHochrien said,he talked to you yesterday.

## 2014-08-22 NOTE — Telephone Encounter (Signed)
Schedule a 48 hour Holter after the POET (Plain Old Exercise Treadmill)

## 2014-08-23 ENCOUNTER — Encounter: Payer: Medicare Other | Admitting: Gastroenterology

## 2014-08-23 ENCOUNTER — Other Ambulatory Visit: Payer: Self-pay | Admitting: *Deleted

## 2014-08-23 DIAGNOSIS — R002 Palpitations: Secondary | ICD-10-CM

## 2014-08-23 LAB — PANCREATIC ELASTASE, FECAL: Pancreatic Elastase-1, Stool: >500 mcg/g

## 2014-08-23 NOTE — Telephone Encounter (Signed)
Pt.to have a 48 hr monitor place on for palpitations after he has his poet

## 2014-08-24 ENCOUNTER — Encounter: Payer: Self-pay | Admitting: Cardiology

## 2014-09-07 ENCOUNTER — Encounter (HOSPITAL_COMMUNITY): Payer: Medicare Other

## 2014-09-12 ENCOUNTER — Telehealth (HOSPITAL_COMMUNITY): Payer: Self-pay

## 2014-09-12 NOTE — Telephone Encounter (Signed)
Encounter complete. 

## 2014-09-14 ENCOUNTER — Ambulatory Visit (HOSPITAL_COMMUNITY)
Admission: RE | Admit: 2014-09-14 | Discharge: 2014-09-14 | Disposition: A | Discharge status: Home / Self Care | Payer: Medicare Other | Source: Ambulatory Visit | Attending: Cardiology | Admitting: Cardiology

## 2014-09-14 DIAGNOSIS — R9431 Abnormal electrocardiogram [ECG] [EKG]: Secondary | ICD-10-CM | POA: Insufficient documentation

## 2014-09-14 NOTE — Procedures (Signed)
Exercise Treadmill Test  Test  Exercise Tolerance Test Ordering MD: Angelina SheriffJake Hochrein, MD    Unique Test No: 1  Treadmill:  1  Indication for ETT: ABN EKG  Contraindication to ETT: No   Stress Modality: exercise - treadmill  Cardiac Imaging Performed: non   Protocol: standard Bruce - maximal  Max BP:  131/60  Max MPHR (bpm):  142 85% MPR (bpm):  121  MPHR obtained (bpm):  142 % MPHR obtained:  100  Reached 85% MPHR (min:sec):  5:45 Total Exercise Time (min-sec):  9  Workload in METS:  10.1 Borg Scale: 15  Reason ETT Terminated:  Mild SOB and THR achieved    ST Segment Analysis At Rest: NSR with PACs With Exercise: no evidence of significant ST depression  Other Information Arrhythmia:  Occasional PVC Angina during ETT:  absent (0) Quality of ETT:  diagnostic  ETT Interpretation:  normal - no evidence of ischemia by ST analysis  Comments: ETT with good exercise tolerance (9:00); normal BP response; no chest pain; no ST changes; negative adequate ETT; Duke treadmill score 9.  Olga MillersBrian Rumor Sun

## 2014-09-19 ENCOUNTER — Encounter: Payer: Self-pay | Admitting: Cardiology

## 2014-09-19 NOTE — Telephone Encounter (Signed)
Provided results of stress test to patient. He was very appreciative of the phone call. Requesting copy of results to be mailed to him and left at the front desk. Completed. Phone call back to patient to clarify that the copy of the results have [facility]. Left this message on both his home and mobile phone. Results also sent to Dr. Arlyss RepressBromgren.

## 2014-09-22 ENCOUNTER — Telehealth: Payer: Self-pay

## 2014-09-22 NOTE — Telephone Encounter (Signed)
Patient notified ok to schedule EGD .  He is scheduled for EGD 10/19/14 and pre-visit for 3/28

## 2014-09-22 NOTE — Telephone Encounter (Signed)
-----   Message from Meryl DareMalcolm T Stark, MD sent at 09/22/2014 10:22 AM EST ----- Regarding: RE: GXT Stress test read as negative. See my recent office nite. If patient wants to schedule egd as discussed at office visit it appears cardiology has cleared to do so.  ----- Message -----    From: Annett FabianSheri L Avaleigh Decuir, RN    Sent: 09/22/2014   9:40 AM      To: Meryl DareMalcolm T Stark, MD Subject: RE: GXT                                        I believe it is the treadmill stress test .  See under notes tab _ Dr. Jens Somrenshaw ----- Message -----    From: Meryl DareMalcolm T Stark, MD    Sent: 09/21/2014   9:32 PM      To: Annett FabianSheri L Annjeanette Sarwar, RN Subject: FW: GXT                                        I don't know what GXT is or where to find the results.  ----- Message -----    From: Vincenza HewsJames C Wildman, LPN    Sent: 1/6/10963/03/2015   4:22 PM      To: Meryl DareMalcolm T Stark, MD Subject: GXT                                            Pt. Wanted Dr. Russella DarStark to see his results of his GXT

## 2014-09-25 ENCOUNTER — Encounter: Payer: Self-pay | Admitting: Neurology

## 2014-09-25 ENCOUNTER — Ambulatory Visit (INDEPENDENT_AMBULATORY_CARE_PROVIDER_SITE_OTHER): Payer: Medicare Other | Admitting: Neurology

## 2014-09-25 VITALS — BP 135/75 | HR 85 | Resp 24 | Ht 69.75 in | Wt 148.0 lb

## 2014-09-25 DIAGNOSIS — F5105 Insomnia due to other mental disorder: Secondary | ICD-10-CM | POA: Diagnosis not present

## 2014-09-25 DIAGNOSIS — F489 Nonpsychotic mental disorder, unspecified: Secondary | ICD-10-CM

## 2014-09-25 NOTE — Progress Notes (Signed)
[facility]   Provider:  Melvyn Novas, M D  Referring Provider: Mosetta Putt, MD Primary Care Physician:  Carolyne Fiscal, MD  Chief Complaint  Patient presents with  . RV Insomnia    Rm 10, alone    HPI:  Sean Burns is a 80 y.o. male seen here as a referral  from Dr. Duaine Dredge for evaluation of insomnia and cognitive impairment, and the possible relation to depression and medication.  Sean Burns, a Caucasian gentleman, right-handed is seen here today for the evaluation of the above chief complaints. I have visit received a very kind referral letter from Dr. Duaine Dredge as well as a short letter from the patient's spouse regarding 2 concerns about underlying conditions affecting his sleep as well as medication and medication side effect concerns. I would like to states here in short that the patient has noticed some recent minor memory changes he feels that to so-called confusional episodes or not of major importance to his day-to-day life and rather on a total period he has long-standing history of depression as well as insomnia and was treated with trazodone and alprazolam.  These medications helped him to get more restful sleep at night and an increase in fluoxetine from 20-40 mg in addition is perceived by the patient as having lifted him further out of a depression.  In today's review of systems he endorsed feeling often hot and cold, coughing at night, some anxiety some change in appetite mild tremor at rest confusion insomnia hearing loss palpitations and weight loss. The patient and his family both related a episode of Clostridium difficile infection in 2015 as well as a bilateral pneumonia in 2014 as periods of physical decline and some cognitive changes. He became very fatigued, lost weight and was tremolus. He became hoarse and he still has a change in appetite. He was negative for H. Pylori and HIV .  He has seen Dr. Lazarus Salines, who performed a sinus CT and suggested an  up dosing in GERD medication. This was done, and has not changed the hoarseness. He feels not as sharp as he used to be.  He is however feeling better on higher doses of Prozac than on lower and his sleep is improved. He has no choking episodes, no dysphagia. No known deficiencies in Vitamins or minerals. He has a mild anemia.   The patient goes to bed around 10 PM, retreats to a bedroom that is described as core, quiet and dark. Has 3 times nocturia, which wakes him. He used tamsolusin.  He describes his sleep as interrupted as it is fragmented by bathroom breaks, it may take him up to half an hour to fall asleep again after bathroom break and he rises finally at about 7 AM in the morning. Usually he wakes spontaneously at the time. 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 He feels coffee doesn't taste good anymore.  He has breakfast at home, he leaves the house every day, is exposed to daytime sunlight, he walks daily.  He exercises otherwise irregularly. He does not take daytime naps. He watches TV at night , not in the bedroom. He has  been told that he has been snoring on occasion , but apnea was not witnessed .   The patient has had no sinus, neck or airway surgery, he had wisdom tooth removed, tonsillectomy in childhood at age 24.  No TBI history .  No family history of insomnia, apnea or parasomnia. He has not been a tight terror or sleep walker.   MOCA 26-30 , this patient scored in the normal range for a college graduate and for his age group. He was able to do the serial sevens he missed 2 out of 5 recall words which is an average for any patient over 70. He was fully oriented short normal attention span could repeat  complicated sentences correctly and was able to abstract he would name objects including 3 animals was able to draw a clock face placed hands and And numbers correctly and to draw a three-dimensional cube. He failed the trail making test.  PLAN : depression induced insomnia,  Some underlying anxiety.   Belsomra 10 mg trial. If this medication causes a groggy effect in AM or is simply not effective, change back to trazodone and add melatonin, 5 or 10 mg at bedtime.  Sleep study not needed at this time . Nocturia treatment with PCP or urology. Try to avoid anticholinergic medications.  Reduce the trazodone by 505 for one week and start taking melatonin, if this is successful reduce to D/c trazodone. You may take Belsomra any time in this process.   09-25-14 Interval history ; CD  That he has worked on his sleep hygiene his sleep habits and his exercise routine which has improved his sleep to some degree. He has not tried the Belsomra but he is taking coenzyme Q 10 and magnesium daily he continues to take Flomax and dB. He takes a quarter of a tablet of 50 mg nightly. He has added melatonin at 5 mg his current dose of Synthroid at 88 g ; Prozac 20 mg ; Lipitor 40 mg and a baby aspirin is added.  He appears still very apprehensive, anxious.      Review of Systems: Out of a complete 14 system review, the patient complains of only the following symptoms, and all other reviewed systems are negative. 09-25-14  Epworth score 0 from 0  , Fatigue severity score  21 from 17   ,  Geriatric depression score 4 points .    History   Social History  . Marital Status: Divorced    Spouse Name: N/A  . Number of Children: 2  . Years of Education: N/A   Occupational History  . Retired     Airline pilot   Social History Main Topics  . Smoking status: Never Smoker   . Smokeless tobacco: Never Used  . Alcohol Use: No  . Drug Use: No  . Sexual Activity: Not on file   Other Topics Concern  . Not on file    Social History Narrative   Caffeine 1-2 cup monthly.  Retired.  2 kids.  Married.    Family History  Problem Relation Age of Onset  . Colon cancer Neg Hx   . Hypertension Mother   . Stroke Mother 49  . Heart attack Father 70    Died age 55  . Stomach cancer Maternal Grandmother     ?    Past Medical History  Diagnosis Date  . Anxiety   . Hyperlipidemia   . GERD (gastroesophageal reflux disease)   . Hypothyroidism   . Depression   .  BPH (benign prostatic hypertrophy)   . Change in voice   . Loss of appetite   . Insomnia due to medical condition 07/31/2014  . Clostridium difficile infection   . Carotid stenosis     Mild    Past Surgical History  Procedure Laterality Date  . Cataract extraction Bilateral 2008  . Wisdom tooth extraction  2011  . Tonsillectomy  1942  . Torn retina Left 2008    left  . Eye surgery  2009    both eyes; laser for eye pressure    Current Outpatient Prescriptions  Medication Sig Dispense Refill  . aspirin 81 MG tablet Take 81 mg by mouth daily.    Marland Kitchen. atorvastatin (LIPITOR) 40 MG tablet Take 40 mg by mouth daily.    . Cholecalciferol (VITAMIN D-3 PO) Take 2,000 Units by mouth daily.     Marland Kitchen. co-enzyme Q-10 30 MG capsule Take 100 mg by mouth daily.    Marland Kitchen. FLUoxetine (PROZAC) 20 MG capsule Take 1 capsule (20 mg total) by mouth daily.    . magnesium gluconate (MAGONATE) 500 MG tablet Take 500 mg by mouth daily.    . Melatonin 5 MG TABS Take 5 mg by mouth at bedtime.    . Multiple Vitamins-Minerals (MULTIVITAMIN WITH MINERALS) tablet Take 1 tablet by mouth daily.    Marland Kitchen. SYNTHROID 88 MCG tablet Take 88 mcg by mouth daily.  1  . Tamsulosin HCl (FLOMAX) 0.4 MG CAPS 0.4 mg daily after supper.     . traZODone (DESYREL) 50 MG tablet Take 50 mg by mouth at bedtime. Taking 1/4 tablet once daily.  3  . Suvorexant (BELSOMRA PO) Take by mouth.     No current facility-administered medications for this visit.    Allergies as of 09/25/2014 - Review Complete  09/25/2014  Allergen Reaction Noted  . Feldene [piroxicam] Hives 07/22/2012    Vitals: BP 135/75 mmHg  Pulse 85  Resp 24  Ht 5' 9.75" (1.772 m)  Wt 148 lb (67.132 kg)  BMI 21.38 kg/m2 Last Weight:  Wt Readings from Last 1 Encounters:  09/25/14 148 lb (67.132 kg)       Last Height:   Ht Readings from Last 1 Encounters:  09/25/14 5' 9.75" (1.772 m)    Physical exam:  General: The patient is awake, alert and appears not in acute distress. The patient is well groomed. Head: Normocephalic, atraumatic. Neck is supple. Mallampati 2-3 , red palate . neck circumference: 15.5  . Nasal airflow unrestricted  TMJ is not evident . Retrognathia is not seen. No dentures .  Cardiovascular:  Regular rate and rhythm , without  murmurs or carotid bruit, and without distended neck veins. Respiratory: Lungs are clear to auscultation. Skin:  Without evidence of edema, or rash Trunk: BMI is  elevated and patient  has normal posture.  Neurologic exam : The patient is awake and alert, oriented to place and time.   Memory subjective described as intact.  There is a normal attention span & concentration ability. Speech is fluent without dysarthria, dysphonia or aphasia. Mood and affect are appropriate.  Cranial nerves: Pupils are equal and briskly reactive to light.  Funduscopic exam without  evidence of pallor or edema. Extraocular movements  in vertical and horizontal planes intact and without nystagmus. The patient is status post cataract surgery in both eyes. Visual fields by finger perimetry are intact. Hearing to finger rub intact. The patient reports that he has had some hearing loss, but there is no  discrepancy between air conduction and bone conduction.  Facial sensation intact to fine touch. Facial motor strength is symmetric and tongue and uvula move midline. Tongue without tremor.   Motor exam:   Normal tone ,muscle bulk and symmetric ,strength in all extremities. There is no evidence of  cogwheeling or rigidity. Normal relaxation.  Sensory:  Fine touch, pinprick and vibration were tested in all extremities. Proprioception is normal. Vibration is reduced over the right ankle- medial and lateral.   Coordination: Rapid alternating movements in the fingers/hands is normal. Finger-to-nose maneuver normal without evidence of ataxia, dysmetria . The patient has a mild low amplitude bilateral hand tremor with outstretched arms. There is no pill-rolling noticed and the amplitude is not parkinsonian.  Gait and station: Patient walks without assistive device and is able unassisted to climb up to the exam table.  He is moving slower, he feels unstable. He feels this is new , onset about 1 year ago.  Strength within normal limits. Stance is stable and normal. Tandem gait is fragmented. Romberg testing is negative. There is no shuffling gait noticed. The patient was able unassisted to raise from a seated position climb up to the exam table and return. He turns with 3 steps  Deep tendon reflexes: in the  upper and lower extremities are  Brisk , 2 plus , especially brisk in patella and achilles tendon.--symmetric.  Babinski maneuver response is downgoing.   Assessment:  After physical and neurologic examination, review of laboratory studies, imaging, neurophysiology testing and pre-existing records, assessment is   0) insomnia with sleep fragmentation. No excessive daytime sleepiness, no snoring. Stay on fluoxetine.  1) nocturia fragmentation of sleep , attributed to  BPH. I have little evidence that this patient has apnea based on his low body mass index is rather slender, he has a normal muscle tone, he only wakes up with a dry mouth which could be medication induced.  What I would like to check is if there is any presence of central sleep apnea which would not be related to snoring and often patients gasp for air not being aware of it and arouse from deeper stages of sleep to lighter stages of  sleep but not necessarily to full wakefulness. The offered trail of  belsomra was not used by the patient. .  2) depression in clinical remission. 3) essential , non parkinsonian tremor.  4) cognitive testing indicated normal range for age. MOCA 26-30 .    The patient was advised of the nature of the diagnosed sleep disorder , the treatment options and risks for general a health and wellness arising from not treating the condition. Visit duration was 25 minutes.  The patient appears to benefit from an antidepressant medication and had previously tried Celexa or Lexapro.  I think there are good reasons to continue the Prozac also it may induce a mild tremor and may induce excessive yawning in some patients,   which can be interpreted as excessive daytime sleepiness even that the symptoms symptom and the true sense of the world is not present. The patient endorsed no sleepiness in daytime,  which I would expect if he would have an hangover effect from trazodone or benzodiazepine.  Based that he feels he sleeps better on these medications I would leave it up to him to decide if he should or should not continue with a sleep aid. There is a new but patent branded medication on the market called Belsomra . Several of my patients that have  not been on true sleeping pills in the past have done well with that but it is excessively expensive in comparison to established medications that may give the same safety profile.  Trazodone can impair cognitive functions , as  can long-term use of benzodiazepines.   Plan:  Treatment plan and additional workup :  depression induced insomnia,  Some underlying anxiety.  Sleep study not needed at this time . Patient's sleep has improved by changing some of his habits and he has not made any use of the Belsomra as he didn't need it. He has a medication available should he need it. A sleep study would have a rather low yield as I have explained before  I  think that the  patient is on the right path in  the changing his habits and introducing more exercise andtherefore not recommend any further changes melatonin , over-the-counter,  and he has had no significant medical interval history ( negative cardiac stress test, planned endoscopy next month )  that would have affected his sleep in any way. rt taking melatonin,. You may take Belsomra any time in this process.   This patient is considered  safe to try Belsomra.     Porfirio Mylar Izaia Say MD  09/25/2014

## 2014-09-25 NOTE — Patient Instructions (Signed)
Insomnia Insomnia is frequent trouble falling and/or staying asleep. Insomnia can be a long term problem or a short term problem. Both are common. Insomnia can be a short term problem when the wakefulness is related to a certain stress or worry. Long term insomnia is often related to ongoing stress during waking hours and/or poor sleeping habits. Overtime, sleep deprivation itself can make the problem worse. Every little thing feels more severe because you are overtired and your ability to cope is decreased. CAUSES   Stress, anxiety, and depression.  Poor sleeping habits.  Distractions such as TV in the bedroom.  Naps close to bedtime.  Engaging in emotionally charged conversations before bed.  Technical reading before sleep.  Alcohol and other sedatives. They may make the problem worse. They can hurt normal sleep patterns and normal dream activity.  Stimulants such as caffeine for several hours prior to bedtime.  Pain syndromes and shortness of breath can cause insomnia.  Exercise late at night.  Changing time zones may cause sleeping problems (jet lag). It is sometimes helpful to have someone observe your sleeping patterns. They should look for periods of not breathing during the night (sleep apnea). They should also look to see how long those periods last. If you live alone or observers are uncertain, you can also be observed at a sleep clinic where your sleep patterns will be professionally monitored. Sleep apnea requires a checkup and treatment. Give your caregivers your medical history. Give your caregivers observations your family has made about your sleep.  SYMPTOMS   Not feeling rested in the morning.  Anxiety and restlessness at bedtime.  Difficulty falling and staying asleep. TREATMENT   Your caregiver may prescribe treatment for an underlying medical disorders. Your caregiver can give advice or help if you are using alcohol or other drugs for self-medication. Treatment  of underlying problems will usually eliminate insomnia problems.  Medications can be prescribed for short time use. They are generally not recommended for lengthy use.  Over-the-counter sleep medicines are not recommended for lengthy use. They can be habit forming.  You can promote easier sleeping by making lifestyle changes such as:  Using relaxation techniques that help with breathing and reduce muscle tension.  Exercising earlier in the day.  Changing your diet and the time of your last meal. No night time snacks.  Establish a regular time to go to bed.  Counseling can help with stressful problems and worry.  Soothing music and white noise may be helpful if there are background noises you cannot remove.  Stop tedious detailed work at least one hour before bedtime. HOME CARE INSTRUCTIONS   Keep a diary. Inform your caregiver about your progress. This includes any medication side effects. See your caregiver regularly. Take note of:  Times when you are asleep.  Times when you are awake during the night.  The quality of your sleep.  How you feel the next day. This information will help your caregiver care for you.  Get out of bed if you are still awake after 15 minutes. Read or do some quiet activity. Keep the lights down. Wait until you feel sleepy and go back to bed.  Keep regular sleeping and waking hours. Avoid naps.  Exercise regularly.  Avoid distractions at bedtime. Distractions include watching television or engaging in any intense or detailed activity like attempting to balance the household checkbook.  Develop a bedtime ritual. Keep a familiar routine of bathing, brushing your teeth, climbing into bed at the same   time each night, listening to soothing music. Routines increase the success of falling to sleep faster.  Use relaxation techniques. This can be using breathing and muscle tension release routines. It can also include visualizing peaceful scenes. You can  also help control troubling or intruding thoughts by keeping your mind occupied with boring or repetitive thoughts like the old concept of counting sheep. You can make it more creative like imagining planting one beautiful flower after another in your backyard garden.  During your day, work to eliminate stress. When this is not possible use some of the previous suggestions to help reduce the anxiety that accompanies stressful situations. MAKE SURE YOU:   Understand these instructions.  Will watch your condition.  Will get help right away if you are not doing well or get worse. Document Released: 06/27/2000 Document Revised: 09/22/2011 Document Reviewed: 07/28/2007 [employer] Patient Information 2015 [employer]. This information is not intended to replace advice given to you by your health care provider. Make sure you discuss any questions you have with your health care provider.  

## 2014-10-09 ENCOUNTER — Ambulatory Visit (AMBULATORY_SURGERY_CENTER): Payer: Self-pay | Admitting: *Deleted

## 2014-10-09 VITALS — Ht 68.25 in | Wt 152.0 lb

## 2014-10-09 DIAGNOSIS — R634 Abnormal weight loss: Secondary | ICD-10-CM

## 2014-10-09 NOTE — Progress Notes (Signed)
Patient denies any allergies to eggs or soy. Patient denies any problems with anesthesia/sedation. Patient denies any oxygen use at home and does not take any diet/weight loss medications. EMMI education assisgned to patient on EGD, this was explained and instructions given to patient. 

## 2014-10-19 ENCOUNTER — Encounter: Payer: Self-pay | Admitting: Gastroenterology

## 2014-10-19 ENCOUNTER — Ambulatory Visit (AMBULATORY_SURGERY_CENTER): Payer: Medicare Other | Admitting: Gastroenterology

## 2014-10-19 VITALS — BP 135/79 | HR 72 | Temp 97.0°F | Resp 37 | Ht 68.0 in | Wt 152.0 lb

## 2014-10-19 DIAGNOSIS — K208 Other esophagitis: Secondary | ICD-10-CM

## 2014-10-19 DIAGNOSIS — R634 Abnormal weight loss: Secondary | ICD-10-CM | POA: Diagnosis present

## 2014-10-19 DIAGNOSIS — K2210 Ulcer of esophagus without bleeding: Secondary | ICD-10-CM

## 2014-10-19 DIAGNOSIS — R490 Dysphonia: Secondary | ICD-10-CM | POA: Diagnosis not present

## 2014-10-19 MED ORDER — SODIUM CHLORIDE 0.9 % IV SOLN: 500.0000 mL | INTRAVENOUS | Status: DC

## 2014-10-19 NOTE — Patient Instructions (Signed)
YOU HAD AN ENDOSCOPIC PROCEDURE TODAY AT THE [facility]:   Refer to the procedure report that was given to you for any specific questions about what was found during the examination.  If the procedure report does not answer your questions, please call your gastroenterologist to clarify.  If you requested that your care partner not be given the details of your procedure findings, then the procedure report has been included in a sealed envelope for you to review at your convenience later.  YOU SHOULD EXPECT: Some feelings of bloating in the abdomen. Passage of more gas than usual.  Walking can help get rid of the air that was put into your GI tract during the procedure and reduce the bloating. If you had a lower endoscopy (such as a colonoscopy or flexible sigmoidoscopy) you may notice spotting of blood in your stool or on the toilet paper. If you underwent a bowel prep for your procedure, you may not have a normal bowel movement for a few days.  Please Note:  You might notice some irritation and congestion in your nose or some drainage.  This is from the oxygen used during your procedure.  There is no need for concern and it should clear up in a day or so.  SYMPTOMS TO REPORT IMMEDIATELY:    Following upper endoscopy (EGD)  Vomiting of blood or coffee ground material  New chest pain or pain under the shoulder blades  Painful or persistently difficult swallowing  New shortness of breath  Fever of 100F or higher  Black, tarry-looking stools  For urgent or emergent issues, a gastroenterologist can be reached at any hour by calling [phone].   DIET: Your first meal following the procedure should be a small meal and then it is ok to progress to your normal diet. Heavy or fried foods are harder to digest and may make you feel nauseous or bloated.  Likewise, meals heavy in dairy and vegetables can increase bloating.  Drink plenty of fluids but you should avoid alcoholic beverages  for 24 hours.  ACTIVITY:  You should plan to take it easy for the rest of today and you should NOT DRIVE or use heavy machinery until tomorrow (because of the sedation medicines used during the test).    FOLLOW UP: Our staff will call the number listed on your records the next business day following your procedure to check on you and address any questions or concerns that you may have regarding the information given to you following your procedure. If we do not reach you, we will leave a message.  However, if you are feeling well and you are not experiencing any problems, there is no need to return our call.  We will assume that you have returned to your regular daily activities without incident.  If any biopsies were taken you will be contacted by phone or by letter within the next 1-3 weeks.  Please call us at [phone] if you have not heard about the biopsies in 3 weeks.    SIGNATURES/CONFIDENTIALITY: You and/or your care partner have signed paperwork which will be entered into your electronic medical record.  These signatures attest to the fact that that the information above on your After Visit Summary has been reviewed and is understood.  Full responsibility of the confidentiality of this discharge information lies with you and/or your care-partner.  Read all of the handouts given to you by your recovery room nurse, and take your medication every day.

## 2014-10-19 NOTE — Progress Notes (Signed)
Report to PACU, RN, vss, BBS= Clear.  

## 2014-10-19 NOTE — Op Note (Addendum)
[facility] [address] [place], [id number]   ENDOSCOPY PROCEDURE REPORT  PATIENT: Sean KoyanagiHaydock, Shah  MR#: [id number] BIRTHDATE: 12-Jul-1935 , 80  yrs. old GENDER: male ENDOSCOPIST: Meryl DareMalcolm T Stark, MD, Clementeen GrahamFACG REFERRED BY:  Mosetta PuttPeter Blomgren, M.D. PROCEDURE DATE:  10/19/2014 PROCEDURE:  EGD, diagnostic ASA CLASS:     Class II INDICATIONS:  weight loss and Hoarseness-possible GERD/LPR. MEDICATIONS: Monitored anesthesia care and Propofol 140 mg IV TOPICAL ANESTHETIC: none DESCRIPTION OF PROCEDURE: After the risks benefits and alternatives of the procedure were thoroughly explained, informed consent was obtained.  The LB [id number] endoscope was introduced through the mouth and advanced to the second portion of the duodenum , Without limitations.  The instrument was slowly withdrawn as the mucosa was fully examined.    ESOPHAGUS: There was LA Class A esophagitis (One or more mucosal breaks < 5 mm in maximal length) noted.   An inlet patch was located in the proximal esophagus. STOMACH: The mucosa and folds of the stomach appeared normal. DUODENUM: Mild duodenal inflammation was found in the 2nd part of the duodenum.  The duodenal mucosa showed no abnormalities in the duodenal bulb.  Retroflexed views revealed no abnormalities.  The scope was then withdrawn from the patient and the procedure completed.  COMPLICATIONS: There were no immediate complications.  ENDOSCOPIC IMPRESSION: 1.   LA Class A esophagitis 2.   Inlet patch in the proximal esophagus 3.   Mild duodenitis in the 2nd part of the duodenum  RECOMMENDATIONS: 1.  Anti-reflux regimen long term 2.  PPI bid: omeprazole 20 mg po bid long term for mgmt of GERD/esophagitis with suspected LPR 3.  OP follow-up with Dr. Duaine DredgeBlomgren in about 6 weeks.  D] eSigned:  Meryl DareMalcolm T Stark, MD, [facility] 10/19/2014 10:28 AM Revised: 10/19/2014 10:28 AM  cc: Flo ShanksKarol Wolicki, MD

## 2014-10-20 ENCOUNTER — Telehealth: Payer: Self-pay | Admitting: *Deleted

## 2014-10-20 NOTE — Telephone Encounter (Signed)
  Follow up Call-  Call back number 10/19/2014 09/03/2012  Post procedure Call Back phone  # [phone]  Permission to leave phone message Yes Yes     Patient questions:  Do you have a fever, pain , or abdominal swelling? No. Pain Score  0 *  Have you tolerated food without any problems? Yes.    Have you been able to return to your normal activities? Yes.    Do you have any questions about your discharge instructions: Diet   No. Medications  No. Follow up visit  No.  Do you have questions or concerns about your Care? No.  Actions: * If pain score is 4 or above: No action needed, pain <4.

## 2015-01-08 ENCOUNTER — Other Ambulatory Visit: Payer: Self-pay

## 2015-06-26 ENCOUNTER — Ambulatory Visit
Admission: RE | Admit: 2015-06-26 | Discharge: 2015-06-26 | Disposition: A | Discharge status: Home / Self Care | Payer: Medicare Other | Source: Ambulatory Visit | Attending: Family Medicine | Admitting: Family Medicine

## 2015-06-26 ENCOUNTER — Other Ambulatory Visit: Payer: Self-pay | Admitting: Family Medicine

## 2015-06-26 DIAGNOSIS — R059 Cough, unspecified: Secondary | ICD-10-CM

## 2015-06-26 DIAGNOSIS — R05 Cough: Secondary | ICD-10-CM

## 2015-06-29 ENCOUNTER — Other Ambulatory Visit: Payer: Self-pay | Admitting: Family Medicine

## 2015-06-29 DIAGNOSIS — R590 Localized enlarged lymph nodes: Secondary | ICD-10-CM

## 2015-07-03 ENCOUNTER — Ambulatory Visit
Admission: RE | Admit: 2015-07-03 | Discharge: 2015-07-03 | Disposition: A | Discharge status: Home / Self Care | Payer: Medicare Other | Source: Ambulatory Visit | Attending: Family Medicine | Admitting: Family Medicine

## 2015-07-03 DIAGNOSIS — R590 Localized enlarged lymph nodes: Secondary | ICD-10-CM

## 2015-07-03 MED ORDER — IOPAMIDOL (ISOVUE-300) INJECTION 61%
75.0000 mL | Freq: Once | INTRAVENOUS | Status: AC | PRN
  Administered 2015-07-03: 75 mL via INTRAVENOUS

## 2015-07-04 ENCOUNTER — Other Ambulatory Visit: Payer: Self-pay | Admitting: Family Medicine

## 2015-07-04 ENCOUNTER — Telehealth: Payer: Self-pay | Admitting: Cardiology

## 2015-07-04 DIAGNOSIS — E041 Nontoxic single thyroid nodule: Secondary | ICD-10-CM

## 2015-07-04 NOTE — Telephone Encounter (Signed)
Received records from Dr Mosetta PuttPeter Blomgren for appointment on 09/07/15 with Dr Antoine PocheHochrein.  [facility] for Dr Hochrein's schedule on 09/07/15. lp

## 2015-07-10 ENCOUNTER — Ambulatory Visit
Admission: RE | Admit: 2015-07-10 | Discharge: 2015-07-10 | Disposition: A | Discharge status: Home / Self Care | Payer: Medicare Other | Source: Ambulatory Visit | Attending: Family Medicine | Admitting: Family Medicine

## 2015-07-10 DIAGNOSIS — E041 Nontoxic single thyroid nodule: Secondary | ICD-10-CM

## 2015-07-12 ENCOUNTER — Other Ambulatory Visit: Payer: Self-pay | Admitting: Family Medicine

## 2015-07-12 DIAGNOSIS — E041 Nontoxic single thyroid nodule: Secondary | ICD-10-CM

## 2015-07-31 ENCOUNTER — Other Ambulatory Visit (HOSPITAL_COMMUNITY)
Admission: RE | Admit: 2015-07-31 | Discharge: 2015-07-31 | Disposition: A | Discharge status: Home / Self Care | Payer: Medicare Other | Source: Ambulatory Visit | Attending: Interventional Radiology | Admitting: Interventional Radiology

## 2015-07-31 ENCOUNTER — Ambulatory Visit
Admission: RE | Admit: 2015-07-31 | Discharge: 2015-07-31 | Disposition: A | Discharge status: Home / Self Care | Payer: Medicare Other | Source: Ambulatory Visit | Attending: Family Medicine | Admitting: Family Medicine

## 2015-07-31 DIAGNOSIS — E041 Nontoxic single thyroid nodule: Secondary | ICD-10-CM | POA: Insufficient documentation

## 2015-09-06 NOTE — Progress Notes (Signed)
Cardiology Office Note   Date:  09/07/2015   ID:  Sean Burns, DOB 26-Mar-1935, MRN [id number]  PCP:  Carolyne Fiscal, MD  Cardiologist:   Rollene Rotunda, MD   Chief Complaint  Patient presents with  . Abnormal ECG      History of Present Illness: Sean Burns is a 81 y.o. male who presents for follow up of an abnormal EKG. He was noted on recent routine physical T-wave inversion in the high lateral leads. The patient had no cardiac history. I sent him for a POET (Plain Old Exercise Treadmill).  This was negative.  He returns for follow up one year later.  He is actually doing very well.  The patient denies any new symptoms such as chest discomfort, neck or arm discomfort. There has been no new shortness of breath, PND or orthopnea. There have been no reported palpitations, presyncope or syncope.  He exercises routinely.  He was seen by Carolyne Fiscal, MD recently and his EKG was normal.  He is exercising routinely without any limitations.  Past Medical History  Diagnosis Date  . Anxiety   . Hyperlipidemia   . GERD (gastroesophageal reflux disease)   . Hypothyroidism   . Depression   . BPH (benign prostatic hypertrophy)   . Change in voice   . Loss of appetite   . Insomnia due to medical condition 07/31/2014  . Clostridium difficile infection   . Carotid stenosis     Mild    Past Surgical History  Procedure Laterality Date  . Cataract extraction Bilateral 2008  . Wisdom tooth extraction  2011  . Tonsillectomy  1942  . Torn retina Left 2008    left  . Eye surgery  2009    both eyes; laser for eye pressure     Current Outpatient Prescriptions  Medication Sig Dispense Refill  . aspirin 81 MG tablet Take 81 mg by mouth daily.    Marland Kitchen atorvastatin (LIPITOR) 20 MG tablet Take 1 tablet by mouth daily. Take 1 tab by mouth daily  3  . Cholecalciferol (VITAMIN D-3 PO) Take 2,000 Units by mouth daily.     . Melatonin 5 MG TABS Take 5 mg by mouth at bedtime.    . Multiple  Vitamins-Minerals (MULTIVITAMIN WITH MINERALS) tablet Take 1 tablet by mouth daily.    Marland Kitchen omeprazole (PRILOSEC) 20 MG capsule Take 1 capsule by mouth daily. Take 1 tab by mouth daily  3  . SYNTHROID 137 MCG tablet Take 68.5 mcg by mouth daily. Take half tab by mouth daily  3  . SYNTHROID 88 MCG tablet Take 88 mcg by mouth daily.  1  . Tamsulosin HCl (FLOMAX) 0.4 MG CAPS 0.4 mg daily after supper.     . traZODone (DESYREL) 50 MG tablet Take 50 mg by mouth at bedtime. Taking 1/4 tablet once daily.  3   No current facility-administered medications for this visit.    Allergies:   Feldene    ROS:  Please see the history of present illness.   Otherwise, review of systems are positive for  Loss of appetite , cramps, trace leg swelling..   All other systems are reviewed and negative.    PHYSICAL EXAM: VS:  BP 100/70 mmHg  Pulse 72  Ht  (1.753 m)  Wt 143 lb (64.864 kg)  BMI 21.11 kg/m2 , BMI Body mass index is 21.11 kg/(m^2). GENERAL:  Well appearing NECK:  No jugular venous distention, waveform within normal limits, carotid upstroke brisk  and symmetric, no bruits, no thyromegaly LUNGS:  Clear to auscultation bilaterally BACK:  No CVA tenderness CHEST:  Unremarkable HEART:  PMI not displaced or sustained,S1 and S2 within normal limits, no S3, no S4, no clicks, no rubs, no murmurs ABD:  Flat, positive bowel sounds normal in frequency in pitch, no bruits, no rebound, no guarding, no midline pulsatile mass, no hepatomegaly, no splenomegaly EXT:  2 plus pulses throughout, no edema, no cyanosis no clubbing   EKG:  EKG is ordered today. Sinus rhythm, rate 72, axis within normal limits, intervals within normal limits, no acute ST-T wave changes.     Wt Readings from Last 3 Encounters:  09/07/15 143 lb (64.864 kg)  10/19/14 152 lb (68.947 kg)  10/09/14 152 lb (68.947 kg)      Other studies Reviewed: Additional studies/ records that were reviewed today include: Carolyne Fiscal, MD  records Review of the above records demonstrates: As recorded elsewhere   ASSESSMENT AND PLAN:  ABNORMAL EKG:   He had a negative POET (Plain Old Exercise Treadmill).  His EKG is now normal.  He is active without symptoms.  No further testing is indicated.  He can see me as needed.   DYSLIPIDEMIA:   I did review outside labs area his LDL most recently was 41 in December 2017. No change in therapy is indicated.  CAROTID STENOSIS:   He reports having this followed routinely and it is mild. He will continue with risk reduction.  Current medicines are reviewed at length with the patient today.  The patient does not have concerns regarding medicines.  The following changes have been made:  no change  Labs/ tests ordered today include: None  Disposition:   FU with me as needed   Signed, Rollene Rotunda, MD  09/07/2015 1:32 PM    [facility]

## 2015-09-07 ENCOUNTER — Encounter: Payer: Self-pay | Admitting: Cardiology

## 2015-09-07 ENCOUNTER — Ambulatory Visit (INDEPENDENT_AMBULATORY_CARE_PROVIDER_SITE_OTHER): Payer: Medicare Other | Admitting: Cardiology

## 2015-09-07 VITALS — BP 100/70 | HR 72 | Ht 69.0 in | Wt 143.0 lb

## 2015-09-07 DIAGNOSIS — R9431 Abnormal electrocardiogram [ECG] [EKG]: Secondary | ICD-10-CM

## 2015-09-07 NOTE — Patient Instructions (Signed)
Dr Hochrein recommends that you follow-up with him as needed. 

## 2016-09-15 ENCOUNTER — Other Ambulatory Visit: Payer: Self-pay | Admitting: Family Medicine

## 2016-09-15 DIAGNOSIS — I6523 Occlusion and stenosis of bilateral carotid arteries: Secondary | ICD-10-CM

## 2016-09-26 ENCOUNTER — Ambulatory Visit
Admission: RE | Admit: 2016-09-26 | Discharge: 2016-09-26 | Disposition: A | Discharge status: Home / Self Care | Payer: Medicare Other | Source: Ambulatory Visit | Attending: Family Medicine | Admitting: Family Medicine

## 2016-09-26 DIAGNOSIS — I6523 Occlusion and stenosis of bilateral carotid arteries: Secondary | ICD-10-CM

## 2016-11-05 ENCOUNTER — Encounter (HOSPITAL_COMMUNITY): Payer: Medicare Other

## 2017-02-14 ENCOUNTER — Emergency Department (HOSPITAL_COMMUNITY)
Admission: EM | Admit: 2017-02-14 | Discharge: 2017-02-14 | Disposition: A | Discharge status: Home / Self Care | Payer: Medicare Other | Attending: Emergency Medicine | Admitting: Emergency Medicine

## 2017-02-14 ENCOUNTER — Encounter (HOSPITAL_COMMUNITY): Payer: Self-pay | Admitting: Emergency Medicine

## 2017-02-14 DIAGNOSIS — E039 Hypothyroidism, unspecified: Secondary | ICD-10-CM | POA: Insufficient documentation

## 2017-02-14 DIAGNOSIS — Z7982 Long term (current) use of aspirin: Secondary | ICD-10-CM | POA: Insufficient documentation

## 2017-02-14 DIAGNOSIS — R55 Syncope and collapse: Secondary | ICD-10-CM | POA: Diagnosis present

## 2017-02-14 DIAGNOSIS — Z79899 Other long term (current) drug therapy: Secondary | ICD-10-CM | POA: Diagnosis not present

## 2017-02-14 LAB — BASIC METABOLIC PANEL
ANION GAP: 10 (ref 5–15)
BUN: 16 mg/dL (ref 6–20)
CALCIUM: 8.9 mg/dL (ref 8.9–10.3)
CO2: 24 mmol/L (ref 22–32)
Chloride: 102 mmol/L (ref 101–111)
Creatinine, Ser: 0.79 mg/dL (ref 0.61–1.24)
GFR calc Af Amer: >60 mL/min (ref >60)
GLUCOSE: 115 mg/dL — AB (ref 65–99)
Potassium: 4.2 mmol/L (ref 3.5–5.1)
Sodium: 136 mmol/L (ref 135–145)

## 2017-02-14 LAB — CBC
HCT: 37.8 % — ABNORMAL LOW (ref 39.0–52.0)
HEMOGLOBIN: 13.2 g/dL (ref 13.0–17.0)
MCH: 32.1 pg (ref 26.0–34.0)
MCHC: 34.9 g/dL (ref 30.0–36.0)
MCV: 92 fL (ref 78.0–100.0)
PLATELETS: 205 10*3/uL (ref 150–400)
RBC: 4.11 MIL/uL — ABNORMAL LOW (ref 4.22–5.81)
RDW: 13.1 % (ref 11.5–15.5)
WBC: 5.2 10*3/uL (ref 4.0–10.5)

## 2017-02-14 LAB — URINALYSIS, ROUTINE W REFLEX MICROSCOPIC
BILIRUBIN URINE: NEGATIVE
Glucose, UA: NEGATIVE mg/dL
Hgb urine dipstick: NEGATIVE
Ketones, ur: NEGATIVE mg/dL
Leukocytes, UA: NEGATIVE
NITRITE: NEGATIVE
PROTEIN: NEGATIVE mg/dL
SPECIFIC GRAVITY, URINE: 1.012 (ref 1.005–1.030)
pH: 7 (ref 5.0–8.0)

## 2017-02-14 LAB — CBG MONITORING, ED: GLUCOSE-CAPILLARY: 110 mg/dL — AB (ref 65–99)

## 2017-02-14 NOTE — ED Notes (Signed)
Pt provided with water per EDP

## 2017-02-14 NOTE — ED Provider Notes (Signed)
[facility]-EMERGENCY DEPT Provider Note   CSN: [id number] Arrival date & time: 02/14/17  0745     History   Chief Complaint Chief Complaint  Patient presents with  . Near Syncope    HPI Sean Burns is a 82 y.o. male.  HPI   Pt with HL, HTN comes in with cc of dizziness. Pt repots that he was at a [employer], about to order when he started getting dizzy, a little sweaty and fell down. Pt got up, but fell again and so he was sent to the ER. Pt denies chest pain, dib, palpitations. Pt has no hx of CAD, CHF and he has no hx of strokes. Pt reports no dizziness, numbness, tingling, vision changes or exertional chest pain, dib recently. Pt reports that his fall was not hard, and he was able to walk after. He has no pain anywhere and no headaches.    Past Medical History:  Diagnosis Date  . Anxiety   . BPH (benign prostatic hypertrophy)   . Carotid stenosis    Mild  . Change in voice   . Clostridium difficile infection   . Depression   . GERD (gastroesophageal reflux disease)   . Hyperlipidemia   . Hypothyroidism   . Insomnia due to medical condition 07/31/2014  . Loss of appetite     Patient Active Problem List   Diagnosis Date Noted  . Insomnia due to mental condition 09/25/2014  . Insomnia due to medical condition 07/31/2014  . BPH associated with nocturia 07/31/2014  . Insomnia secondary to depression with anxiety 07/31/2014  . Major depressive disorder, single episode, in partial remission (HCC) 07/31/2014    Past Surgical History:  Procedure Laterality Date  . CATARACT EXTRACTION Bilateral 2008  . EYE SURGERY  2009   both eyes; laser for eye pressure  . TONSILLECTOMY  1942  . torn retina Left 2008   left  . WISDOM TOOTH EXTRACTION  2011       Home Medications    Prior to Admission medications   Medication Sig Start Date End Date Taking? Authorizing Provider  aspirin 81 MG tablet Take 81 mg by mouth daily.   Yes [provider]  Cholecalciferol  (VITAMIN D-3 PO) Take 2,000 Units by mouth daily.    Yes [provider]  finasteride (PROSCAR) 5 MG tablet Take 5 mg by mouth at bedtime. 12/03/16  Yes [provider]  fluorouracil (EFUDEX) 5 % cream APPLY TO AFFECTED AREA ON SKIN TWICE DAILY FOR 2 WEEKS 01/27/17  Yes [provider]  Melatonin 5 MG TABS Take 5 mg by mouth at bedtime as needed (sleep).    Yes [provider]  mirtazapine (REMERON) 30 MG tablet Take 30 mg by mouth at bedtime. 12/01/16  Yes [provider]  Multiple Vitamins-Minerals (MULTIVITAMIN WITH MINERALS) tablet Take 1 tablet by mouth daily.   Yes [provider]  rosuvastatin (CRESTOR) 5 MG tablet Take 5 mg by mouth at bedtime. 11/15/16  Yes [provider]  SYNTHROID 50 MCG tablet Take 50 mcg by mouth daily. 12/19/16  Yes [provider]  Tamsulosin HCl (FLOMAX) 0.4 MG CAPS 0.4 mg daily after supper.  06/18/12  Yes [provider]    Family History Family History  Problem Relation Age of Onset  . Hypertension Mother   . Stroke Mother 66  . Heart attack Father 68       Died age 34  . Stomach cancer Maternal Grandmother        ?  Marland Kitchen  Colon cancer Neg Hx     Social History Social History  Substance Use Topics  . Smoking status: Never Smoker  . Smokeless tobacco: Never Used  . Alcohol use No     Allergies   Feldene [piroxicam]   Review of Systems Review of Systems  Neurological: Positive for dizziness.  All other systems reviewed and are negative.    Physical Exam Updated Vital Signs BP (!) 164/86 (BP Location: Right Arm)   Pulse 85   Temp (!) 97.3 F (36.3 C) (Oral)   Resp (!) 24   Ht 5\' 9"  (1.753 m)   Wt 64.4 kg (142 lb)   SpO2 100%   BMI 20.97 kg/m   Physical Exam  Constitutional: He is oriented to person, place, and time. He appears well-developed.  HENT:  Head: Atraumatic.  Neck: Neck supple.  Cardiovascular: Normal rate.   Murmur heard. Pulmonary/Chest:  Effort normal.  Abdominal: Soft. He exhibits no mass. There is no tenderness.  Musculoskeletal: He exhibits no edema, tenderness or deformity.  Neurological: He is alert and oriented to person, place, and time. No cranial nerve deficit.  Skin: Skin is warm.  Nursing note and vitals reviewed.    ED Treatments / Results  Labs (all labs ordered are listed, but only abnormal results are displayed) Labs Reviewed  BASIC METABOLIC PANEL - Abnormal; Notable for the following:       Result Value   Glucose, Bld 115 (*)    All other components within normal limits  CBC - Abnormal; Notable for the following:    RBC 4.11 (*)    HCT 37.8 (*)    All other components within normal limits  CBG MONITORING, ED - Abnormal; Notable for the following:    Glucose-Capillary 110 (*)    All other components within normal limits  URINALYSIS, ROUTINE W REFLEX MICROSCOPIC  I-STAT TROPONIN, ED    EKG  EKG Interpretation  Date/Time:  Saturday February 14 2017 07:58:40 EDT Ventricular Rate:  85 PR Interval:    QRS Duration: 88 QT Interval:  381 QTC Calculation: 453 R Axis:   -5 Text Interpretation:  Sinus rhythm Low voltage, precordial leads Confirmed by Geoffery LyonseLo, Douglas ([id number]) on 02/14/2017 8:17:15 AM       Radiology No results found.  Procedures Procedures (including critical care time)  Medications Ordered in ED Medications - No data to display   Initial Impression / Assessment and Plan / ED Course  I have reviewed the triage vital signs and the nursing notes.  Pertinent labs & imaging results that were available during my care of the patient were reviewed by me and considered in my medical decision making (see chart for details).  Clinical Course as of Feb 15 1407  Sat Feb 14, 2017  1406 Results from the ER workup discussed with the patient face to face and all questions answered to the best of my ability.  Strict ER return precautions have been discussed, and patient is agreeing with the  plan and is comfortable with the workup done and the recommendations from the ER. Specifically, stroke return precautions discussed.  Pt to see pcp in 1 wee for outpatient workup if needed.   [AN]    Clinical Course User Index [AN] Derwood KaplanNanavati, Milanie Rosenfield, MD     Pt comes in with cc of dizziness and fall. No red flags on neurologic side or cardiac side from near syncope perspective. Pt's pmhx is overall benign as well. EKG is overall reassuring and pt is  symptom free.  From the fall itself pt has no pain anywhere. Plan is to observe in the ER for extended period of time and discharge with outpatient f/u if everything turned out normal.   Final Clinical Impressions(s) / ED Diagnoses   Final diagnoses:  Near syncope  Vasovagal syncope    New Prescriptions Discharge Medication List as of 02/14/2017 11:39 AM       Derwood KaplanNanavati, Berda Shelvin, MD 02/14/17 1408

## 2017-02-14 NOTE — ED Notes (Signed)
Bed: WA17 Expected date: 02/14/17 Expected time: 7:43 AM Means of arrival: Ambulance Comments: Syncope

## 2017-02-14 NOTE — ED Notes (Signed)
Per Dr Rhunette CroftNanavati, Istat troponin no longer needed at this time.

## 2017-02-14 NOTE — ED Triage Notes (Signed)
[facility] and started to feel lightheaded. Pt reports that he fell down and does not believe that he has LOC. Pt sts that he hit back of his head. Pt wife reports that pt appeared to "pass out a second time" while attempting to get up from floor. Pt denies blood thinners. Pt sts that he has hx of the same a few years ago. Pt is A&O and in NAD

## 2017-02-14 NOTE — Discharge Instructions (Signed)
We suspect the event of dizziness you had was a vasovagal event.  Hydrate well. See your doctor in 1 week. If the dizziness gets worse, or if you faint - return to the Er. If there is chest pain, palpitations, shortness of breath - return to the ER.

## 2017-02-14 NOTE — ED Triage Notes (Signed)
EMS reports [facility] standing in [facility] [employer], felt faint, fell backward, bumped head, Orthostatic (has history) # 20 L FA and 200 cc fluid given. CBG 123

## 2017-02-17 ENCOUNTER — Encounter: Payer: Self-pay | Admitting: Cardiology

## 2017-06-18 IMAGING — US US CAROTID DUPLEX BILAT
1 series · 13 of 24 positions shown · non-contrast
Comparison: 01/13/2011

CLINICAL DATA: Carotid artery stenosis

EXAM:
BILATERAL CAROTID DUPLEX ULTRASOUND
TECHNIQUE: Gray scale imaging, color Doppler and duplex ultrasound were
performed of bilateral carotid and vertebral arteries in the neck.

[Series 1: us carotid duplex bilat · 0.05mm/px · 13 of 51 slices shown]
[im 1/51]
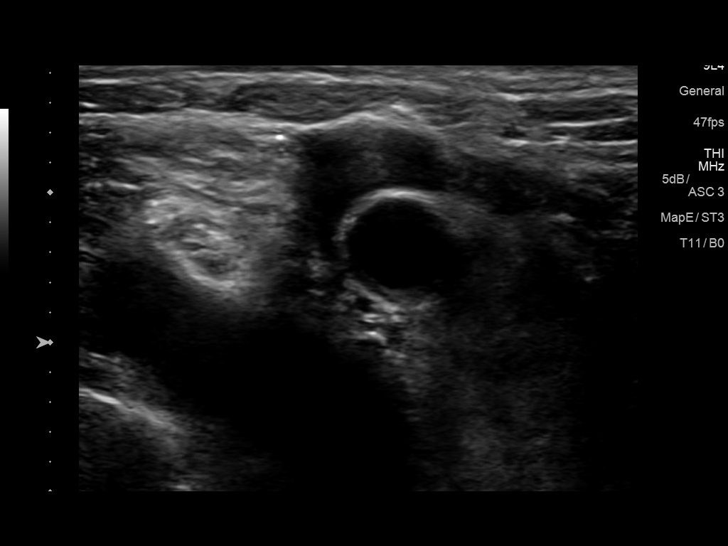
[im 5/51]
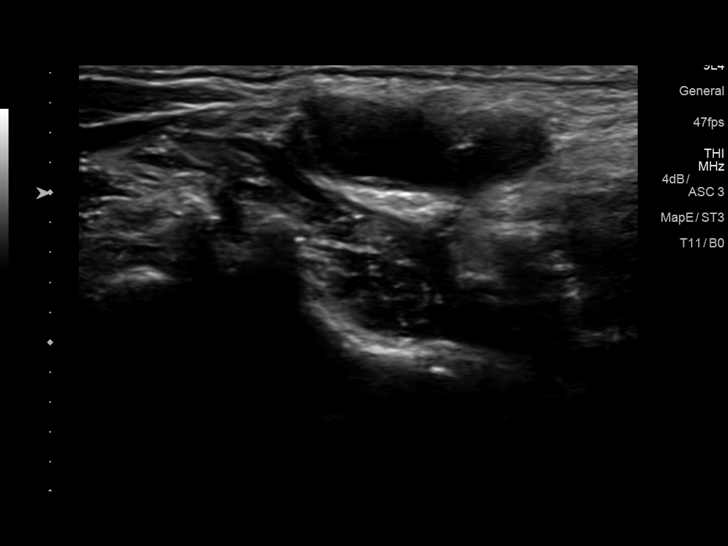
[im 9/51]
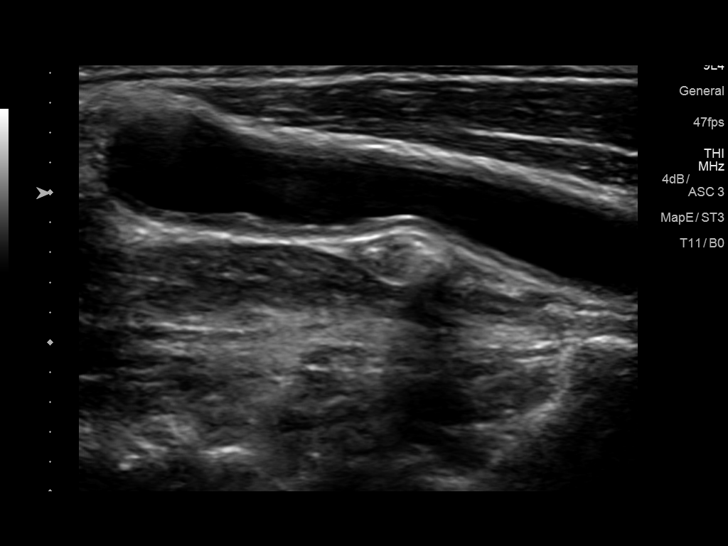
[im 14/51]
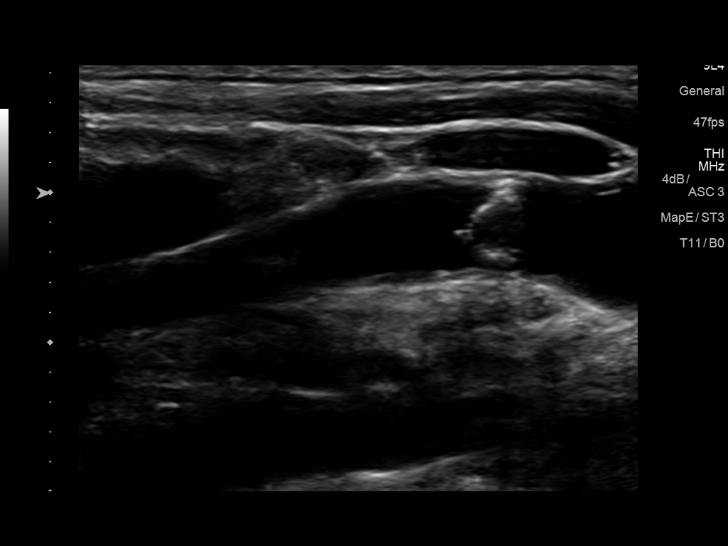
[im 18/51]
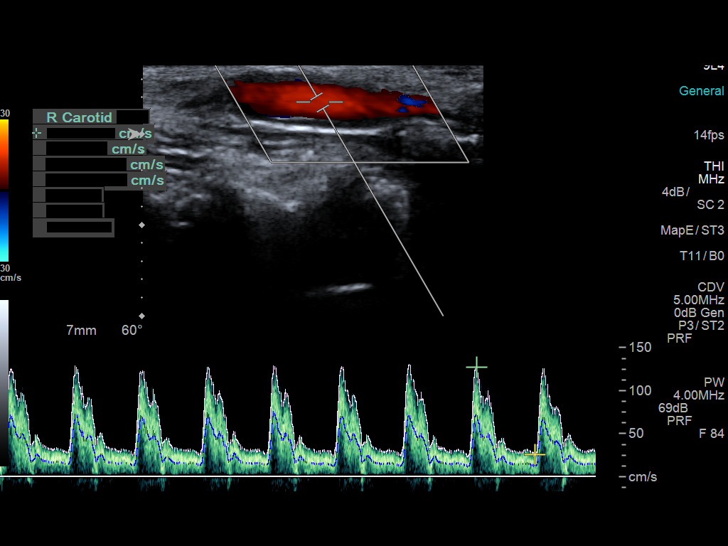
[im 22/51]
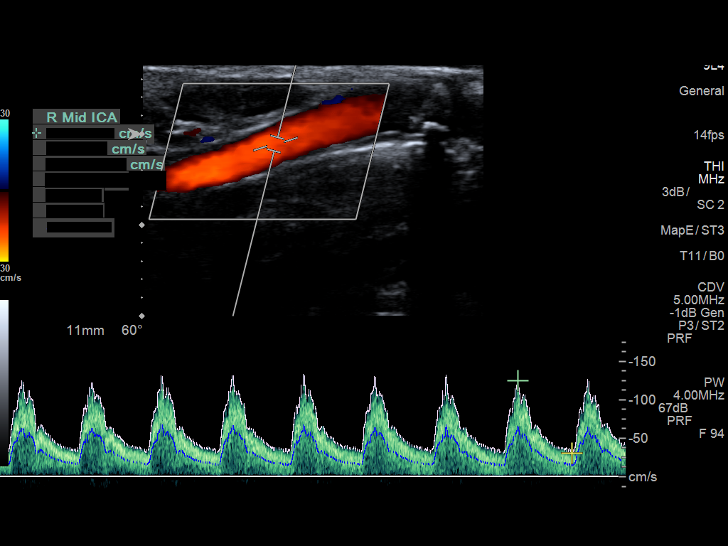
[im 27/51]
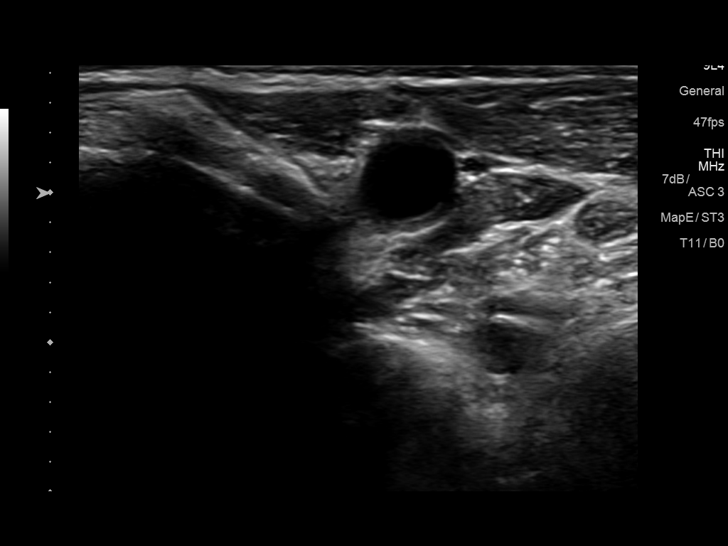
[im 29/51]
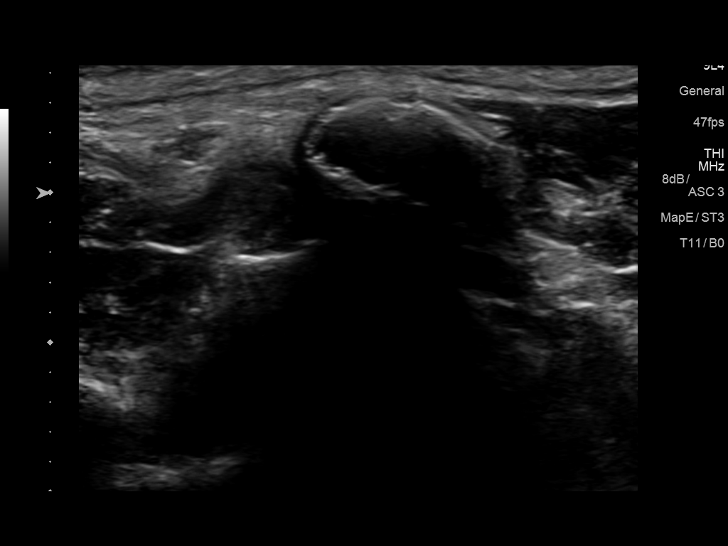
[im 33/51]
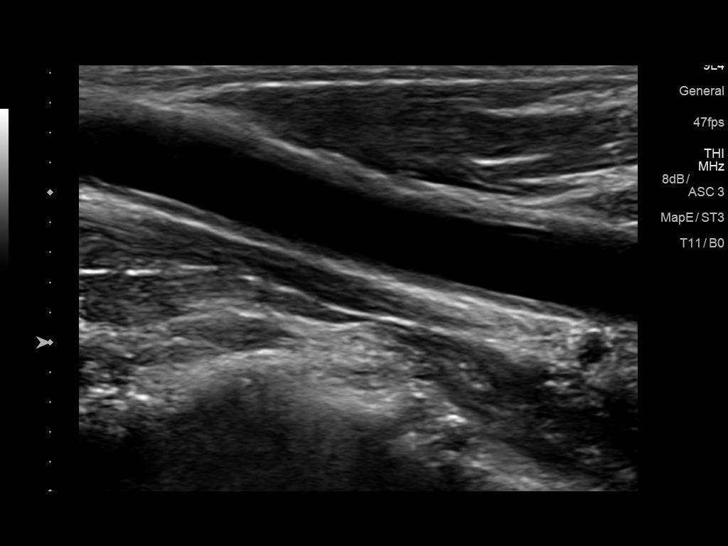
[im 37/51]
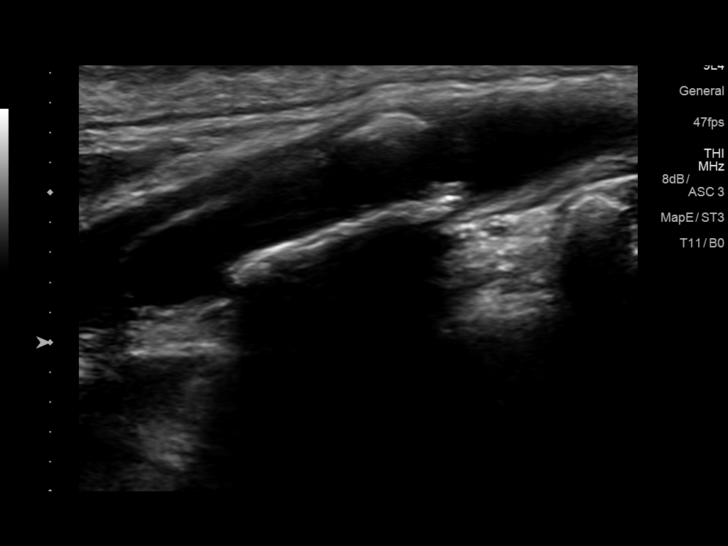
[im 42/51]
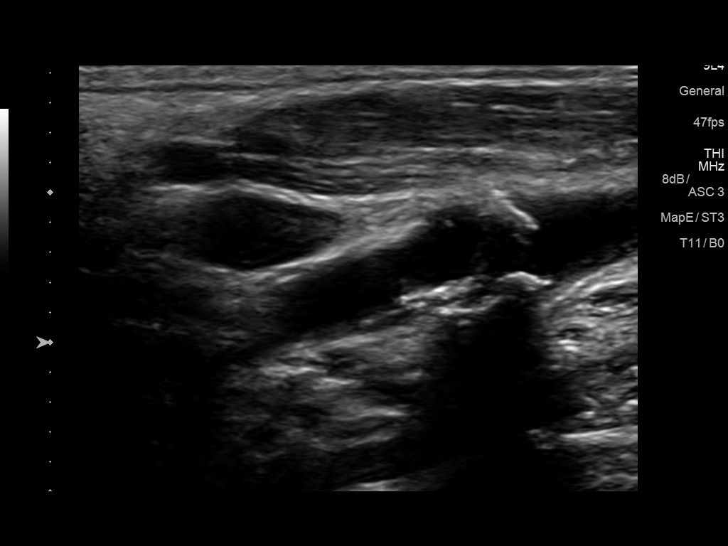
[im 46/51]
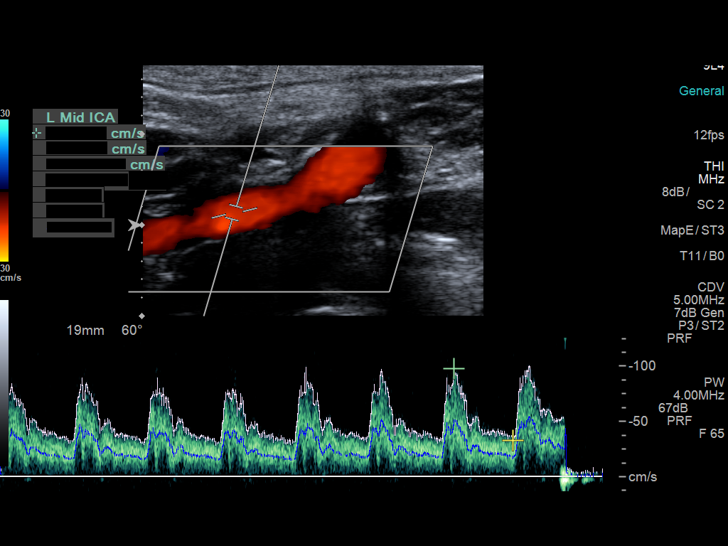
[im 51/51]
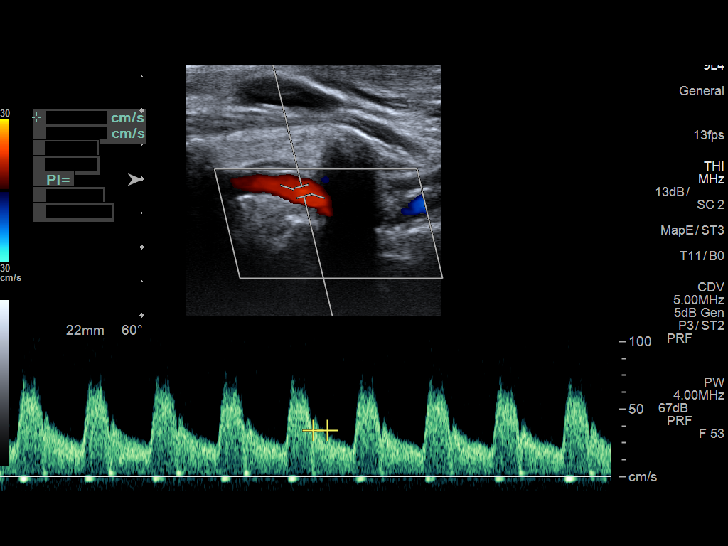

[13 of 24 positions shown; findings below may reference images not displayed]

FINDINGS: Criteria: Quantification of carotid stenosis is based on velocity
parameters that correlate the residual internal carotid diameter
with NASCET-based stenosis levels, using the diameter of the distal
internal carotid lumen as the denominator for stenosis measurement.

The following velocity measurements were obtained:

RIGHT

ICA:  125 cm/sec

CCA:  146 cm/sec

SYSTOLIC ICA/CCA RATIO:

DIASTOLIC ICA/CCA RATIO:

ECA:  168 cm/sec

LEFT

ICA:  162 cm/sec

CCA:  122 cm/sec

SYSTOLIC ICA/CCA RATIO:

DIASTOLIC ICA/CCA RATIO:

ECA:  138 cm/sec

RIGHT CAROTID ARTERY: There is prominent calcified plaque in the
bulb. Low resistance internal carotid Doppler pattern is preserved.

RIGHT VERTEBRAL ARTERY: Antegrade with a normal-appearing Doppler
pattern.

LEFT CAROTID ARTERY: Prominent calcified plaque in the bulb. Low
resistance internal carotid Doppler pattern is preserved.

LEFT VERTEBRAL ARTERY:  Antegrade.
IMPRESSION: There is 50-69% stenosis in the right and left internal carotid
arteries. This has progressed since the prior study.

## 2017-06-19 ENCOUNTER — Encounter: Payer: Self-pay | Admitting: Cardiology

## 2017-06-20 ENCOUNTER — Encounter: Payer: Self-pay | Admitting: Cardiology

## 2017-08-28 IMAGING — CR DG CHEST 2V
2 series · 2 of 2 positions shown · non-contrast
Comparison: 05/05/2014

CLINICAL DATA: Episodic cough for 1 year, nonsmoker

EXAM:
CHEST  2 VIEW

[w chest pa]
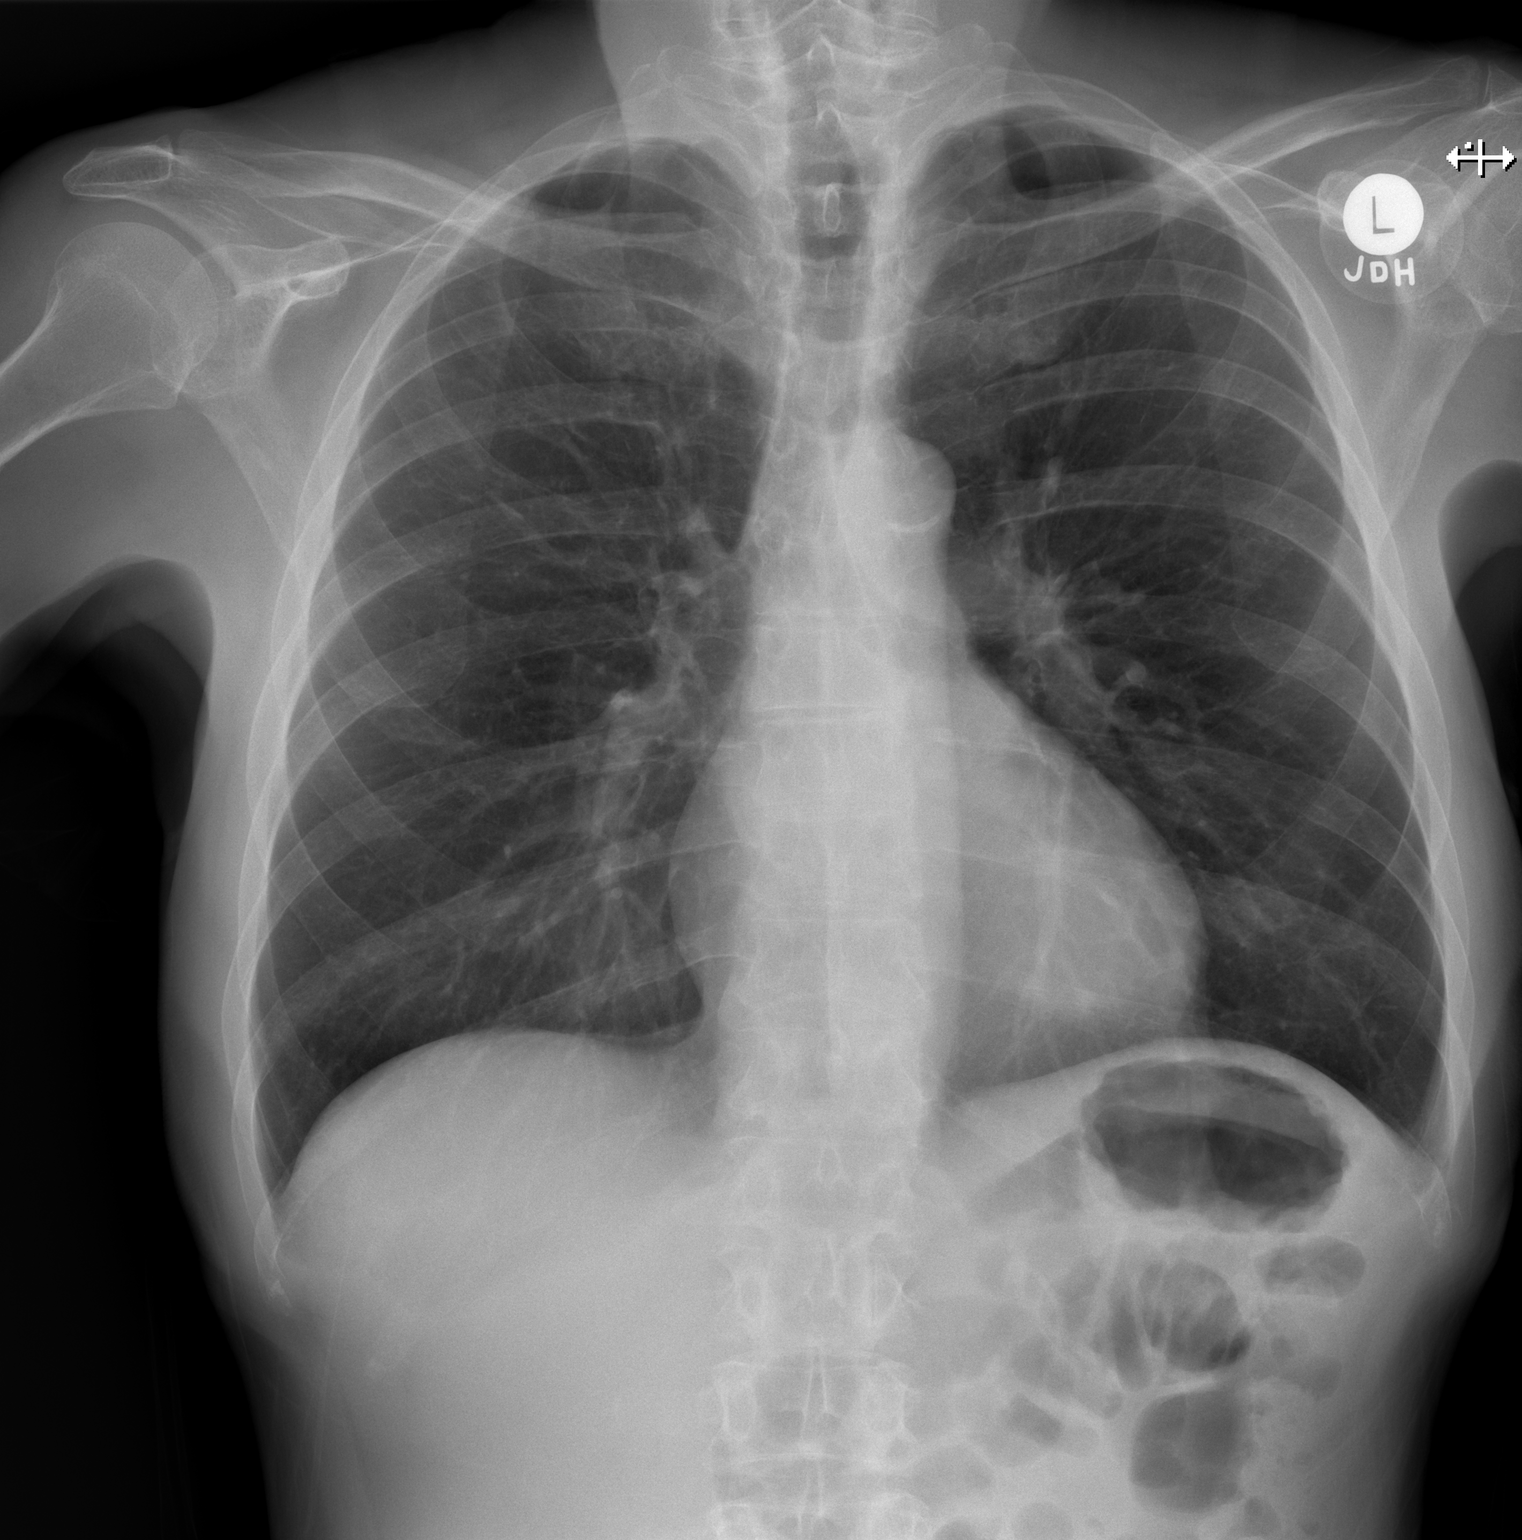

[w chest lat]
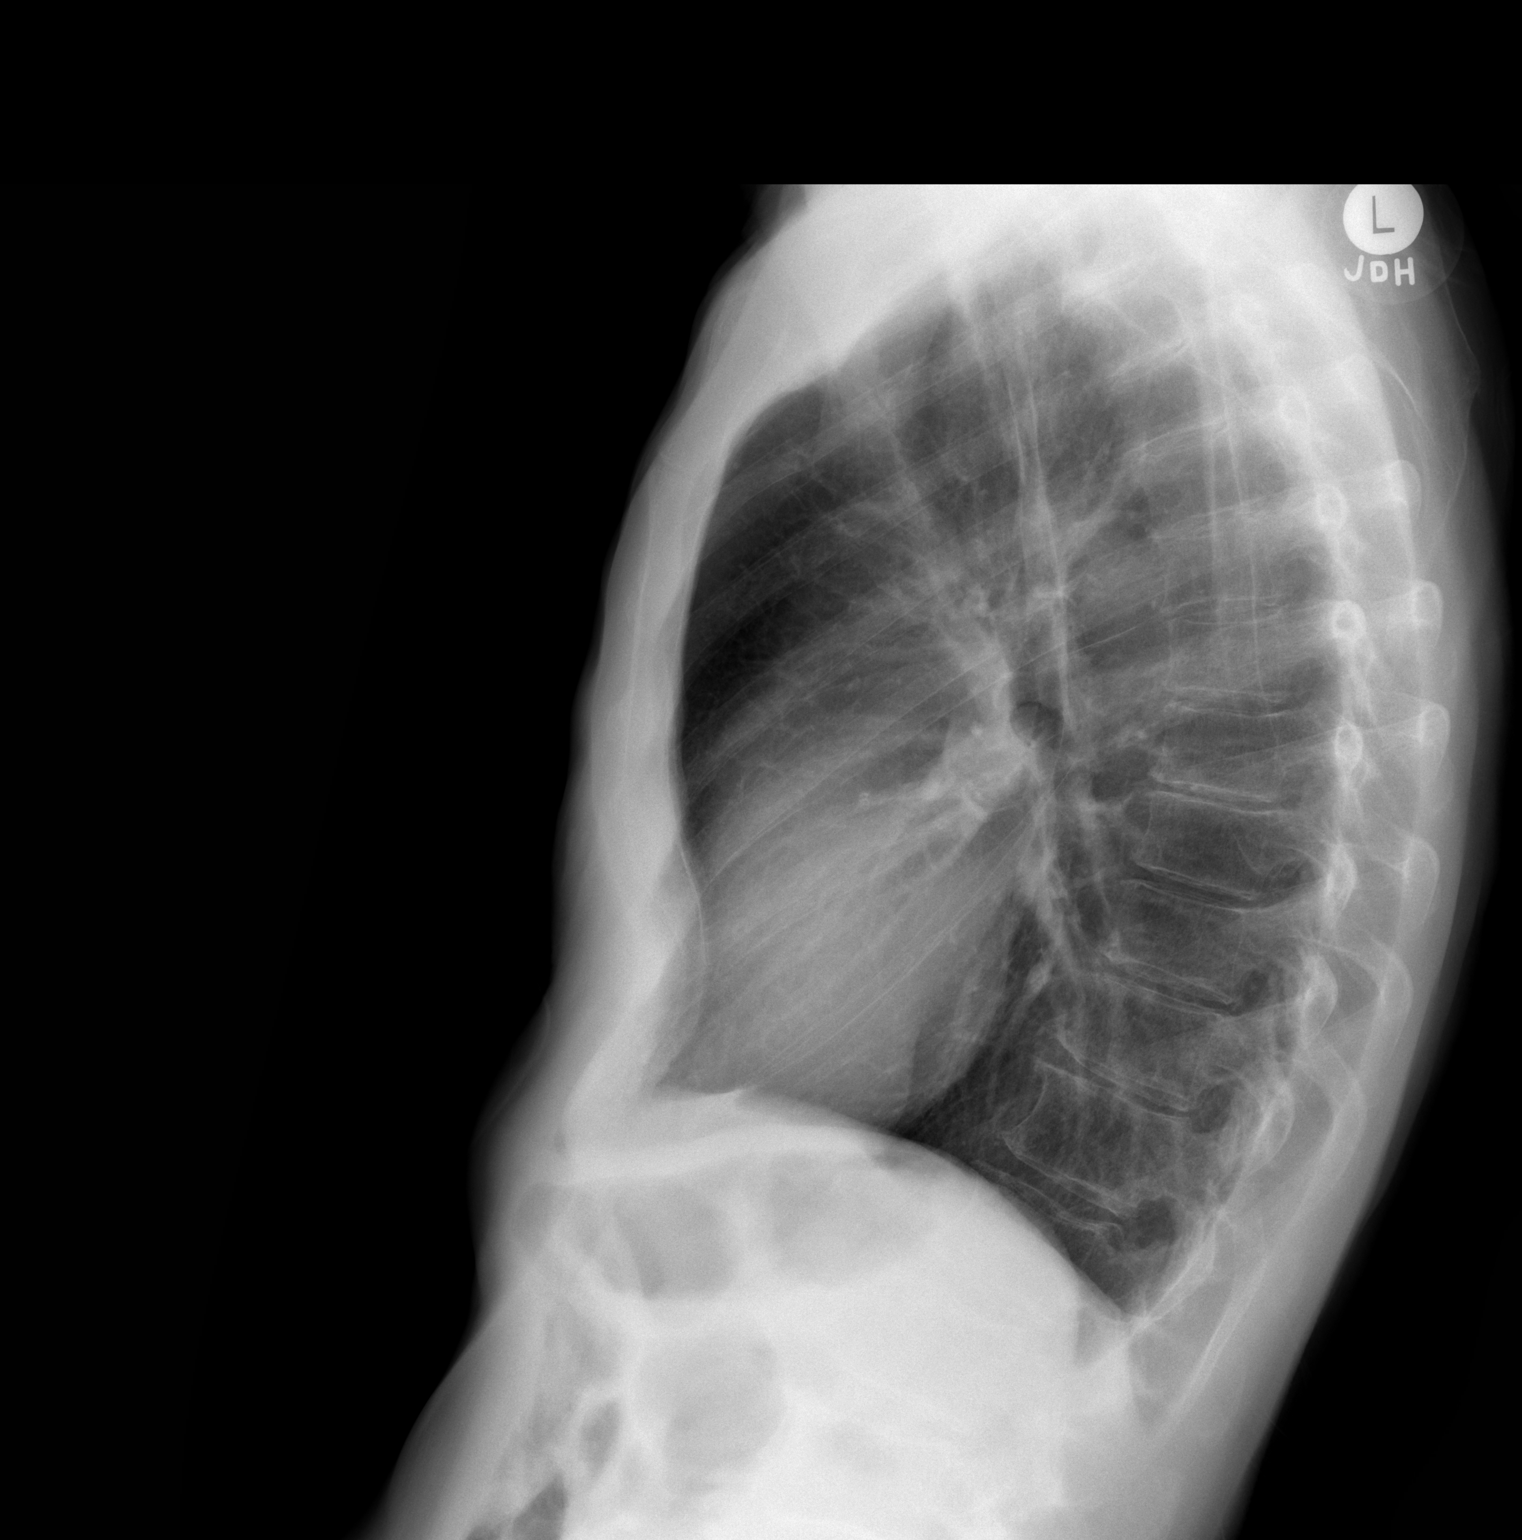

[2 of 2 positions shown; findings below may reference images not displayed]

FINDINGS: The heart size and mediastinal contours are within normal limits.
Both lungs are clear. The visualized skeletal structures are
unremarkable.
IMPRESSION: No active cardiopulmonary disease.

## 2017-10-01 ENCOUNTER — Other Ambulatory Visit (HOSPITAL_COMMUNITY): Payer: Self-pay | Admitting: Family Medicine

## 2017-10-01 DIAGNOSIS — I6523 Occlusion and stenosis of bilateral carotid arteries: Secondary | ICD-10-CM

## 2017-10-05 ENCOUNTER — Ambulatory Visit (HOSPITAL_COMMUNITY)
Admission: RE | Admit: 2017-10-05 | Discharge: 2017-10-05 | Disposition: A | Discharge status: Home / Self Care | Payer: Medicare Other | Source: Ambulatory Visit | Attending: Surgery | Admitting: Surgery

## 2017-10-05 DIAGNOSIS — I6523 Occlusion and stenosis of bilateral carotid arteries: Secondary | ICD-10-CM | POA: Insufficient documentation

## 2018-04-11 ENCOUNTER — Encounter (HOSPITAL_COMMUNITY): Payer: Self-pay | Admitting: Emergency Medicine

## 2018-04-11 ENCOUNTER — Other Ambulatory Visit: Payer: Self-pay

## 2018-04-11 ENCOUNTER — Emergency Department (HOSPITAL_COMMUNITY)
Admission: EM | Admit: 2018-04-11 | Discharge: 2018-04-11 | Disposition: A | Discharge status: Home / Self Care | Payer: Medicare Other | Attending: Emergency Medicine | Admitting: Emergency Medicine

## 2018-04-11 DIAGNOSIS — Z79899 Other long term (current) drug therapy: Secondary | ICD-10-CM | POA: Diagnosis not present

## 2018-04-11 DIAGNOSIS — E860 Dehydration: Secondary | ICD-10-CM | POA: Insufficient documentation

## 2018-04-11 DIAGNOSIS — E039 Hypothyroidism, unspecified: Secondary | ICD-10-CM | POA: Insufficient documentation

## 2018-04-11 DIAGNOSIS — R42 Dizziness and giddiness: Secondary | ICD-10-CM | POA: Insufficient documentation

## 2018-04-11 DIAGNOSIS — R55 Syncope and collapse: Secondary | ICD-10-CM | POA: Diagnosis present

## 2018-04-11 LAB — CBC WITH DIFFERENTIAL/PLATELET
Abs Immature Granulocytes: 0 10*3/uL (ref 0.0–0.1)
BASOS ABS: 0.1 10*3/uL (ref 0.0–0.1)
Basophils Relative: 1 %
Eosinophils Absolute: 0.1 10*3/uL (ref 0.0–0.7)
Eosinophils Relative: 2 %
HEMATOCRIT: 38.7 % — AB (ref 39.0–52.0)
HEMOGLOBIN: 13.1 g/dL (ref 13.0–17.0)
Immature Granulocytes: 1 %
LYMPHS ABS: 0.8 10*3/uL (ref 0.7–4.0)
LYMPHS PCT: 12 %
MCH: 32.5 pg (ref 26.0–34.0)
MCHC: 33.9 g/dL (ref 30.0–36.0)
MCV: 96 fL (ref 78.0–100.0)
Monocytes Absolute: 0.6 10*3/uL (ref 0.1–1.0)
Monocytes Relative: 9 %
NEUTROS ABS: 5 10*3/uL (ref 1.7–7.7)
Neutrophils Relative %: 75 %
Platelets: 182 10*3/uL (ref 150–400)
RBC: 4.03 MIL/uL — AB (ref 4.22–5.81)
RDW: 12.4 % (ref 11.5–15.5)
WBC: 6.6 10*3/uL (ref 4.0–10.5)

## 2018-04-11 LAB — COMPREHENSIVE METABOLIC PANEL
ALBUMIN: 4 g/dL (ref 3.5–5.0)
ALT: 24 U/L (ref 0–44)
ANION GAP: 9 (ref 5–15)
AST: 30 U/L (ref 15–41)
Alkaline Phosphatase: 44 U/L (ref 38–126)
BILIRUBIN TOTAL: 0.8 mg/dL (ref 0.3–1.2)
BUN: 12 mg/dL (ref 8–23)
CHLORIDE: 102 mmol/L (ref 98–111)
CO2: 23 mmol/L (ref 22–32)
Calcium: 9 mg/dL (ref 8.9–10.3)
Creatinine, Ser: 0.9 mg/dL (ref 0.61–1.24)
GFR calc Af Amer: >60 mL/min (ref >60)
GLUCOSE: 119 mg/dL — AB (ref 70–99)
POTASSIUM: 4.1 mmol/L (ref 3.5–5.1)
Sodium: 134 mmol/L — ABNORMAL LOW (ref 135–145)
TOTAL PROTEIN: 6.6 g/dL (ref 6.5–8.1)

## 2018-04-11 LAB — URINALYSIS, ROUTINE W REFLEX MICROSCOPIC
Bilirubin Urine: NEGATIVE
Glucose, UA: NEGATIVE mg/dL
Hgb urine dipstick: NEGATIVE
KETONES UR: 5 mg/dL — AB
LEUKOCYTES UA: NEGATIVE
Nitrite: NEGATIVE
PROTEIN: NEGATIVE mg/dL
Specific Gravity, Urine: 1.009 (ref 1.005–1.030)
pH: 7 (ref 5.0–8.0)

## 2018-04-11 LAB — TSH: TSH: 3.596 u[IU]/mL (ref 0.350–4.500)

## 2018-04-11 LAB — T4, FREE: Free T4: 0.84 ng/dL (ref 0.82–1.77)

## 2018-04-11 LAB — I-STAT TROPONIN, ED: TROPONIN I, POC: 0 ng/mL (ref 0.00–0.08)

## 2018-04-11 MED ORDER — LACTATED RINGERS IV BOLUS: 1000.0000 mL | Freq: Once | INTRAVENOUS | Status: DC

## 2018-04-11 MED ORDER — SODIUM CHLORIDE 0.9 % IV BOLUS
1000.0000 mL | Freq: Once | INTRAVENOUS | Status: AC
  Administered 2018-04-11: 1000 mL via INTRAVENOUS

## 2018-04-11 NOTE — ED Provider Notes (Addendum)
[facility] EMERGENCY DEPARTMENT Provider Note   CSN: [id number] Arrival date & time: 04/11/18  0844     History   Chief Complaint Chief Complaint  Patient presents with  . Near Syncope  . Hypotension    HPI Sean Burns is a 83 y.o. male.  HPI   Sean Burns is a 83 y.o. male, with a history of GERD, hyperlipidemia, hypothyroidism, presenting to the ED with near syncopal episode that occurred shortly prior to arrival.  Patient was standing in a line waiting to get water when he felt lightheaded.  He sat on the floor and felt a little better.  He did not fall.  He felt back to normal within a few minutes.  States he has not eaten this morning.  Had a similar episode last year that he states was due to dehydration.  Denies LOC, confusion, headache, chest pain, shortness of breath, abdominal pain, N/V/D, neuro deficits, palpitations, or any other complaints.     Past Medical History:  Diagnosis Date  . Anxiety   . BPH (benign prostatic hypertrophy)   . Carotid stenosis    Mild  . Change in voice   . Clostridium difficile infection   . Depression   . GERD (gastroesophageal reflux disease)   . Hyperlipidemia   . Hypothyroidism   . Insomnia due to medical condition 07/31/2014  . Loss of appetite     Patient Active Problem List   Diagnosis Date Noted  . Insomnia due to mental condition 09/25/2014  . Insomnia due to medical condition 07/31/2014  . BPH associated with nocturia 07/31/2014  . Insomnia secondary to depression with anxiety 07/31/2014  . Major depressive disorder, single episode, in partial remission (HCC) 07/31/2014    Past Surgical History:  Procedure Laterality Date  . CATARACT EXTRACTION Bilateral 2008  . EYE SURGERY  2009   both eyes; laser for eye pressure  . TONSILLECTOMY  1942  . torn retina Left 2008   left  . WISDOM TOOTH EXTRACTION  2011        Home Medications    Prior to Admission medications   Medication Sig  Start Date End Date Taking? Authorizing Provider  Cholecalciferol (VITAMIN D-3 PO) Take 4,000 Units by mouth daily.    Yes [provider]  finasteride (PROSCAR) 5 MG tablet Take 5 mg by mouth at bedtime. 12/03/16  Yes [provider]  mirtazapine (REMERON) 30 MG tablet Take 30 mg by mouth at bedtime. 12/01/16  Yes [provider]  rosuvastatin (CRESTOR) 10 MG tablet Take 10 mg by mouth at bedtime.  11/15/16  Yes [provider]  SYNTHROID 50 MCG tablet Take 50 mcg by mouth daily. 12/19/16  Yes [provider]  Tamsulosin HCl (FLOMAX) 0.4 MG CAPS 0.4 mg daily after supper.  06/18/12  Yes [provider]    Family History Family History  Problem Relation Age of Onset  . Hypertension Mother   . Stroke Mother 29  . Heart attack Father 69       Died age 5  . Stomach cancer Maternal Grandmother        ?  . Colon cancer Neg Hx     Social History Social History   Tobacco Use  . Smoking status: Never Smoker  . Smokeless tobacco: Never Used  Substance Use Topics  . Alcohol use: No    Alcohol/week: 0.0 standard drinks  . Drug use: No     Allergies   Feldene [piroxicam]  Review of Systems Review of Systems  Constitutional: Negative for chills, diaphoresis and fever.  Respiratory: Negative for shortness of breath.   Cardiovascular: Negative for chest pain.  Gastrointestinal: Negative for abdominal pain, diarrhea, nausea and vomiting.  Genitourinary: Negative for dysuria, frequency and hematuria.  Musculoskeletal: Negative for back pain and neck pain.  Neurological: Positive for syncope (near syncope) and light-headedness (resolved). Negative for dizziness, weakness, numbness and headaches.  Psychiatric/Behavioral: Negative for confusion.  All other systems reviewed and are negative.    Physical Exam Updated Vital Signs BP (!) 150/81   Pulse 81   Resp (!) 25   Ht 5\' 9"  (1.753 m)   Wt 64.4 kg   SpO2 100%   BMI 20.97 kg/m     Physical Exam  Constitutional: He is oriented to person, place, and time. He appears well-developed and well-nourished. No distress.  HENT:  Head: Normocephalic and atraumatic.  Mouth/Throat: Oropharynx is clear and moist.  Eyes: Pupils are equal, round, and reactive to light. Conjunctivae and EOM are normal.  Neck: Neck supple.  Cardiovascular: Normal rate, regular rhythm, normal heart sounds and intact distal pulses.  Pulmonary/Chest: Effort normal and breath sounds normal. No respiratory distress.  Abdominal: Soft. There is no tenderness. There is no guarding.  Musculoskeletal: He exhibits no edema.  Lymphadenopathy:    He has no cervical adenopathy.  Neurological: He is alert and oriented to person, place, and time.  Sensation grossly intact to light touch in the extremities. Strength 5/5 in all extremities. No gait disturbance. Coordination intact. Cranial nerves III-XII grossly intact. No facial droop.   Skin: Skin is warm and dry. He is not diaphoretic.  Psychiatric: He has a normal mood and affect. His behavior is normal.  Nursing note and vitals reviewed.    ED Treatments / Results  Labs (all labs ordered are listed, but only abnormal results are displayed) Labs Reviewed  COMPREHENSIVE METABOLIC PANEL - Abnormal; Notable for the following components:      Result Value   Sodium 134 (*)    Glucose, Bld 119 (*)    All other components within normal limits  CBC WITH DIFFERENTIAL/PLATELET - Abnormal; Notable for the following components:   RBC 4.03 (*)    HCT 38.7 (*)    All other components within normal limits  URINALYSIS, ROUTINE W REFLEX MICROSCOPIC - Abnormal; Notable for the following components:   Ketones, ur 5 (*)    All other components within normal limits  TSH  T4, FREE  I-STAT TROPONIN, ED    EKG EKG Interpretation  Date/Time:  Sunday April 11 2018 08:52:13 EDT Ventricular Rate:  82 PR Interval:    QRS Duration: 90 QT Interval:  395 QTC  Calculation: 462 R Axis:   23 Text Interpretation:  Normal sinus rhythm Low voltage, precordial leads No significant change since last tracing no wpw prolonged qt or brugada Confirmed by Melene Plan [phone]) on 04/11/2018 9:07:17 AM   Radiology No results found.  Procedures Procedures (including critical care time)  Medications Ordered in ED Medications  lactated ringers bolus 1,000 mL (has no administration in time range)  sodium chloride 0.9 % bolus 1,000 mL (0 mLs Intravenous Stopped 04/11/18 1027)     Initial Impression / Assessment and Plan / ED Course  I have reviewed the triage vital signs and the nursing notes.  Pertinent labs & imaging results that were available during my care of the patient were reviewed by me and considered in my medical decision making (  see chart for details).     Patient presents following an episode of lightheadedness.  He did not eat this morning.  Some mild ketonuria, labs otherwise reassuring.  He did have some orthostatic changes, however, he was asymptomatic during the performance of these vital signs.  Additionally, patient remained asymptomatic throughout ED course.  He ambulated without assistance.  Shared decision-making was had regarding second liter of IV fluids.  Patient opted to continue hydration orally at home. The patient and his wife at the bedside were given instructions for home care as well as return precautions.  Both parties voice understanding of these instructions, accept the plan, and are comfortable with discharge.  EKG findings: I think arrhythmia is unlikely. EKG shows normal sinus rhythm with no interval abnormalities such as QT prolongation or WPW. There are no findings to suggest Brugada syndrome. Cardiac monitoring in the emergency department reveals no tachycardic or bradycardic dysrhythmia. Hypertrophic cardiomyopathy was considered, but there are no clear historical elements pointing toward this. EKG is not suggestive; the QRS  voltages is not extremely large and there are no suggestive Q waves.   Findings and plan of care discussed with Melene Plan, DO. Dr. Adela Lank personally evaluated and examined this patient.  Vitals:   04/11/18 0850 04/11/18 0851 04/11/18 0852 04/11/18 0856  BP: (!) 150/81     Pulse: 83 83 81   Resp:  16 (!) 25   SpO2: 100% 100% 100%   Weight:    64.4 kg  Height:    5\' 9"  (1.753 m)   Vitals:   04/11/18 1130 04/11/18 1200 04/11/18 1227 04/11/18 1231  BP: 135/73 138/76 (!) 167/89 (!) 155/82  Pulse: 73 81 88 84  Resp: 19  15 (!) 22  SpO2: 100% 99% 100% 100%  Weight:      Height:         Orthostatic VS for the past 24 hrs:  BP- Lying Pulse- Lying BP- Sitting Pulse- Sitting BP- Standing at 0 minutes Pulse- Standing at 0 minutes  04/11/18 1204 154/80 87 (!) 149/96 92 121/80 100      Final Clinical Impressions(s) / ED Diagnoses   Final diagnoses:  Dehydration    ED Discharge Orders    None       Concepcion Living 04/11/18 1246    Cyntha Brickman, Hillard Danker, PA-C 04/11/18 1248    Melene Plan, DO 04/11/18 1556

## 2018-04-11 NOTE — ED Triage Notes (Signed)
Per EMS: Pt at [place], felt lightheaded, eased himself to the floor. Denies LOC. Pt states same episode happened about a year ago.  Pt also notes he had not eaten breakfast.  CBG 112, BP 70/40, Sp02 100% RA.  Pt denies any pain.

## 2018-04-11 NOTE — ED Notes (Signed)
Obtained orthostatic vs on pt, tolerated well.

## 2018-04-11 NOTE — ED Notes (Signed)
Pt aware we need a urine specimen. 

## 2018-04-11 NOTE — ED Notes (Signed)
Pt stable, ambulatory, and verbalizes understanding of d/c instructions.  

## 2018-04-11 NOTE — Discharge Instructions (Signed)
Dehydration ° °We suspect you may be dehydrated.  °Symptoms of any other illness will be intensified and complicated by dehydration. Dehydration can also extend the duration of symptoms. Dehydration typically causes its own symptoms including lightheadedness, nausea, headaches, fatigue increased thirst, and generally feeling unwell. Drink plenty of fluids and get plenty of rest. You should be drinking at least half a liter of water every hour or two to stay hydrated. Electrolyte drinks (ex. Gatorade, Powerade, Pedialyte) are also encouraged. You should be drinking enough fluids to make your urine light yellow, almost clear. If this is not the case, you are not drinking enough water. °

## 2018-05-04 NOTE — Progress Notes (Signed)
Cardiology Office Note   Date:  05/05/2018   ID:  Sean Burns, DOB [birth year], MRN [id number]  PCP:  Mosetta Putt, MD  Cardiologist:   No primary care provider on file. Referring:  Mosetta Putt, MD  Chief Complaint  Patient presents with  . Loss of Consciousness      History of Present Illness: Sean Burns is a 83 y.o. male who presents for an abnormal EKG. He had T wave inversion in the high lateral leads. The patient had no cardiac history. I sent him for a POET (Plain Old Exercise Treadmill).  This was negative.  He returns for follow up.  He has had syncope.  He was in the emergency room and August for this.  He had another presyncopal episode in September.  Part of this was related to dehydration.  I did review emergency room records.  I reviewed office records from [place], Theron Arista, MD .  He was supposed to stop his tamsulosin but he did not do this until after he had a third near syncopal episode.  Is not been off that for couple of weeks and has had no further events.  Is not describing orthostasis.  He has had no palpitations.  I did review an event monitor and it was sinus rhythm with premature atrial and rare premature ventricular contractions but no significant dysrhythmias.  He says he did have some orthostatic blood pressure drop a few weeks ago.   In the office today he was slightly orthostatic but it did improve after 3 minutes.Marland Kitchen  He otherwise has felt relatively well.  He is lost weight and is not drinking much fluid.  Mosetta Putt, MD mention some possible depression.  He denies any other cardiovascular symptoms. The patient denies any new symptoms such as chest discomfort, neck or arm discomfort. There has been no new shortness of breath, PND or orthopnea. There have been no reported palpitations, presyncope or syncope.    Past Medical History:  Diagnosis Date  . Anxiety   . BPH (benign prostatic hypertrophy)   . Carotid stenosis    Mild  . Change in voice     . Clostridium difficile infection   . Depression   . GERD (gastroesophageal reflux disease)   . Hyperlipidemia   . Hypothyroidism   . Insomnia due to medical condition 07/31/2014  . Loss of appetite     Past Surgical History:  Procedure Laterality Date  . CATARACT EXTRACTION Bilateral 2008  . EYE SURGERY  2009   both eyes; laser for eye pressure  . TONSILLECTOMY  1942  . torn retina Left 2008   left  . WISDOM TOOTH EXTRACTION  2011     Current Outpatient Medications  Medication Sig Dispense Refill  . Cholecalciferol (VITAMIN D-3 PO) Take 4,000 Units by mouth daily.     . finasteride (PROSCAR) 5 MG tablet Take 5 mg by mouth at bedtime.  1  . mirtazapine (REMERON) 30 MG tablet Take 30 mg by mouth at bedtime.  2  . rosuvastatin (CRESTOR) 10 MG tablet Take 10 mg by mouth at bedtime.   2  . SYNTHROID 50 MCG tablet Take 50 mcg by mouth daily.  3   No current facility-administered medications for this visit.     Allergies:   Feldene [piroxicam]    ROS:  Please see the history of present illness.   Otherwise, review of systems are positive for none.   All other systems are reviewed and negative.  PHYSICAL EXAM: VS:  BP 104/62   Pulse 92   Ht 5\' 9"  (1.753 m)   Wt 137 lb 9.6 oz (62.4 kg)   SpO2 97%   BMI 20.32 kg/m  , BMI Body mass index is 20.32 kg/m. GENERAL:  Well appearing, slightly frail NECK:  No jugular venous distention, waveform within normal limits, carotid upstroke brisk and symmetric, no bruits, no thyromegaly LUNGS:  Clear to auscultation bilaterally CHEST:  Unremarkable HEART:  PMI not displaced or sustained,S1 and S2 within normal limits, no S3, no S4, no clicks, no rubs, no murmurs ABD:  Flat, positive bowel sounds normal in frequency in pitch, no bruits, no rebound, no guarding, no midline pulsatile mass, no hepatomegaly, no splenomegaly EXT:  2 plus pulses throughout, no edema, no cyanosis no clubbing   EKG:  EKG is not ordered today. The ekg  ordered 04/11/18 demonstrates sinus rhythm, rate 82, axis within normal limits, intervals within normal limits, no acute ST changes.   Recent Labs: 04/11/2018: ALT 24; BUN 12; Creatinine, Ser 0.90; Hemoglobin 13.1; Platelets 182; Potassium 4.1; Sodium 134; TSH 3.596    Lipid Panel No results found for: CHOL, TRIG, HDL, CHOLHDL, VLDL, LDLCALC, LDLDIRECT    Wt Readings from Last 3 Encounters:  05/05/18 137 lb 9.6 oz (62.4 kg)  04/11/18 142 lb (64.4 kg)  02/14/17 142 lb (64.4 kg)      Other studies Reviewed: Additional studies/ records that were reviewed today include: Office and ED records. Review of the above records demonstrates:  Please see elsewhere in the note.     ASSESSMENT AND PLAN:  SYNCOPE: I do agree that this is likely related to tamsulosin which is now been discontinued.  He is feeling better.  He has had some weight loss with some probable protein calorie malnutrition and dehydration.  We talked about this as well.  I do not see any dysrhythmias.  He was mildly orthostatic.  We did talk about moving and changing positions slowly.  Given all this I do not think that further cardiovascular testing is suggested unless he has recurrent symptoms at which point I would use extended monitoring.  ABNORMAL EKG:   He had a negative POET (Plain Old Exercise Treadmill) in 2016. No further work up. needed.   CAROTID STENOSIS:    He  had 40 - 59% carotid stenosis on the right earlier this year.   He will have this followed in one year.     Current medicines are reviewed at length with the patient today.  The patient does not have concerns regarding medicines.  The following changes have been made:  no change  Labs/ tests ordered today include: None No orders of the defined types were placed in this encounter.    Disposition:   FU with me in one year.     Signed, Rollene Rotunda, MD  05/05/2018 9:18 AM    [facility]

## 2018-05-05 ENCOUNTER — Encounter: Payer: Self-pay | Admitting: Cardiology

## 2018-05-05 ENCOUNTER — Ambulatory Visit (INDEPENDENT_AMBULATORY_CARE_PROVIDER_SITE_OTHER): Payer: Medicare Other | Admitting: Cardiology

## 2018-05-05 VITALS — BP 104/62 | HR 92 | Ht 69.0 in | Wt 137.6 lb

## 2018-05-05 DIAGNOSIS — I6521 Occlusion and stenosis of right carotid artery: Secondary | ICD-10-CM

## 2018-05-05 DIAGNOSIS — I6523 Occlusion and stenosis of bilateral carotid arteries: Secondary | ICD-10-CM

## 2018-05-05 DIAGNOSIS — R55 Syncope and collapse: Secondary | ICD-10-CM

## 2018-05-05 NOTE — Patient Instructions (Signed)
Medication Instructions:  Continue current medications  If you need a refill on your cardiac medications before your next appointment, please call your pharmacy.  Labwork: None Ordered   If you have labs (blood work) drawn today and your tests are completely normal, you will receive your results only by: . MyChart Message (if you have MyChart) OR . A paper copy in the mail If you have any lab test that is abnormal or we need to change your treatment, we will call you to review the results.  Testing/Procedures: None Ordered  Follow-Up: You will need a follow up appointment in 1 Year.  Please call our office 2 months in advance([phone]) to schedule the appointment.  You may see  DR Hochrein or one of the following Advanced Practice Providers on your designated Care Team:   . Kathryn Lawrence, DNP, ANP . Rhonda Barrett, PA-C .  . Luke Kilroy, PA-C . Krista Kroeger, PA-C . Callie Goodrich, PA-C .  . Hao Meng, PA-C . Angela Duke, PA-C  At [facility], you and your health needs are our priority.  As part of our continuing mission to provide you with exceptional heart care, we have created designated Provider Care Teams.  These Care Teams include your primary Cardiologist (physician) and Advanced Practice Providers (APPs -  Physician Assistants and Nurse Practitioners) who all work together to provide you with the care you need, when you need it.   Thank you for choosing [facility]!!       

## 2018-05-06 ENCOUNTER — Other Ambulatory Visit: Payer: Self-pay

## 2019-06-02 ENCOUNTER — Telehealth: Payer: Medicare Other | Admitting: Cardiology

## 2019-06-23 NOTE — Progress Notes (Signed)
Virtual Visit via Telephone Note   This visit type was conducted due to national recommendations for restrictions regarding the COVID-19 Pandemic (e.g. social distancing) in an effort to limit this patient's exposure and mitigate transmission in our community.  Due to his co-morbid illnesses, this patient is at least at moderate risk for complications without adequate follow up.  This format is felt to be most appropriate for this patient at this time.  The patient did not have access to video technology/had technical difficulties with video requiring transitioning to audio format only (telephone).  All issues noted in this document were discussed and addressed.  No physical exam could be performed with this format.  Please refer to the patient's chart for his  consent to telehealth for [facility].   Date:  06/24/2019   ID:  Rica Koyanagi, DOB 09-12-1934, MRN [id number]  Patient Location: Home Provider Location: Home  PCP:  Mosetta Putt, MD  Cardiologist:  Rollene Rotunda, MD  Electrophysiologist:  None   Evaluation Performed:  Follow-Up Visit  Chief Complaint:  Syncope  History of Present Illness:    Sean Burns is a 84 y.o. male  who I saw previously for an abnormal EKG.  He had T wave inversion in the high lateral leads. The patient had no cardiac history. I sent him for a POET (Plain Old Exercise Treadmill). This was negative. He had syncope after this.  He did have some orthostatic hypotension.    Since I last saw him since I last saw him he has done well.  Has had no further episodes of syncope.Marland Kitchen He denies any chest pressure, neck or arm discomfort.  He has no shortness of breath, PND or orthopnea.  He said no weight gain or edema.  He tries to be active around his house  The patient does not have symptoms concerning for COVID-19 infection (fever, chills, cough, or new shortness of breath).    Past Medical History:  Diagnosis Date  . Anxiety   . BPH (benign prostatic  hypertrophy)   . Carotid stenosis    Mild  . Change in voice   . Clostridium difficile infection   . Depression   . GERD (gastroesophageal reflux disease)   . Hyperlipidemia   . Hypothyroidism   . Insomnia due to medical condition 07/31/2014  . Loss of appetite    Past Surgical History:  Procedure Laterality Date  . CATARACT EXTRACTION Bilateral 2008  . EYE SURGERY  2009   both eyes; laser for eye pressure  . TONSILLECTOMY  1942  . torn retina Left 2008   left  . WISDOM TOOTH EXTRACTION  2011    Prior to Admission medications   Medication Sig Start Date End Date Taking? Authorizing Provider  Cholecalciferol (VITAMIN D-3 PO) Take 4,000 Units by mouth daily.    Yes [provider]  finasteride (PROSCAR) 5 MG tablet Take 5 mg by mouth at bedtime. 12/03/16  Yes [provider]  mirtazapine (REMERON) 30 MG tablet Take 30 mg by mouth at bedtime. 12/01/16  Yes [provider]  rosuvastatin (CRESTOR) 10 MG tablet Take 10 mg by mouth at bedtime.  11/15/16  Yes [provider]  SYNTHROID 50 MCG tablet Take 50 mcg by mouth daily. 12/19/16  Yes [provider]    Allergies:   Feldene [piroxicam]   Social History   Tobacco Use  . Smoking status: Never Smoker  . Smokeless tobacco: Never Used  Substance Use Topics  . Alcohol use:  No    Alcohol/week: 0.0 standard drinks  . Drug use: No     Family Hx: The patient's family history includes Heart attack (age of onset: 93) in his father; Hypertension in his mother; Stomach cancer in his maternal grandmother; Stroke (age of onset: 71) in his mother. There is no history of Colon cancer.  ROS:   Please see the history of present illness.    As stated in the HPI and negative for all other systems. All other systems reviewed and are negative.   Prior CV studies:   The following studies were reviewed today:  EKG, Holter  Labs/Other Tests and Data Reviewed:    EKG:  An ECG dated 05/06/18 was  personally reviewed today and demonstrated:  nsr, no acute ST T wave changes  Recent Labs: No results found for requested labs within last 8760 hours.   Recent Lipid Panel No results found for: CHOL, TRIG, HDL, CHOLHDL, LDLCALC, LDLDIRECT  Wt Readings from Last 3 Encounters:  06/24/19 142 lb (64.4 kg)  05/05/18 137 lb 9.6 oz (62.4 kg)  04/11/18 142 lb (64.4 kg)     Objective:    Vital Signs:  BP (!) 136/96 (BP Location: Left Arm)   Pulse 83   Wt 142 lb (64.4 kg)   BMI 20.97 kg/m    VITAL SIGNS:  reviewed  ASSESSMENT & PLAN:    SYNCOPE:  Has had no further syncope.  This was probably a vagal episode or related to his medications most likely.  No change in therapy is indicated.  No further work-up.   ABNORMAL EKG: I looked at his EKGs and there was a tremor probably that was mimicking atrial fibrillation.  However, neither his Holter or his EKG demonstrated A. fib.  No further work-up.  He had a negative POET (Plain Old Exercise Treadmill) in 2016.   HTN: His blood pressure is very slightly elevated on the diastolic and he is getting keep an eye on this.  I reviewed old records and this was not the usual.  He will let me know however.  CAROTID STENOSIS:  He  had 40 - 59% carotid stenosis in March of last year.  I probably follow this up in the fall of next year once the pandemic has receded hopefully.  In the meantime he will work with his primary physician for good lipid control.    COVID-19 Education: The signs and symptoms of COVID-19 were discussed with the patient and how to seek care for testing (follow up with PCP or arrange E-visit).  We discussed the vaccineThe importance of social distancing was discussed today.  Time:   Today, I have spent 20 minutes with the patient with telehealth technology discussing the above problems.     Medication Adjustments/Labs and Tests Ordered: Current medicines are reviewed at length with the patient today.  Concerns regarding  medicines are outlined above.   Tests Ordered: No orders of the defined types were placed in this encounter.   Medication Changes: No orders of the defined types were placed in this encounter.   Follow Up:  In Person in one year.   Signed, Minus Breeding, MD  06/24/2019 11:22 AM    [facility]

## 2019-06-24 ENCOUNTER — Telehealth (INDEPENDENT_AMBULATORY_CARE_PROVIDER_SITE_OTHER): Payer: Medicare Other | Admitting: Cardiology

## 2019-06-24 ENCOUNTER — Telehealth: Payer: Self-pay

## 2019-06-24 ENCOUNTER — Telehealth: Payer: Self-pay | Admitting: Cardiology

## 2019-06-24 ENCOUNTER — Encounter: Payer: Self-pay | Admitting: Cardiology

## 2019-06-24 VITALS — BP 136/96 | HR 83 | Wt 142.0 lb

## 2019-06-24 DIAGNOSIS — R55 Syncope and collapse: Secondary | ICD-10-CM

## 2019-06-24 DIAGNOSIS — R9431 Abnormal electrocardiogram [ECG] [EKG]: Secondary | ICD-10-CM

## 2019-06-24 DIAGNOSIS — I6529 Occlusion and stenosis of unspecified carotid artery: Secondary | ICD-10-CM

## 2019-06-24 DIAGNOSIS — I10 Essential (primary) hypertension: Secondary | ICD-10-CM | POA: Diagnosis not present

## 2019-06-24 NOTE — Telephone Encounter (Signed)
New Message   Pt is calling and wants to make sure his appt is a telephone visit and not a video visit

## 2019-06-24 NOTE — Telephone Encounter (Signed)
DPR on file. Left detailed message of 12/11 AVS instructions. Stated AVS to be mailed to pt address  Letter including After Visit Summary and any other necessary documents to be mailed to the patient's address on file.  Staff message sent to Medical Records pool to mail AVS to patient address.

## 2019-06-24 NOTE — Telephone Encounter (Signed)
I have changed the visit to a telephone visit.  Pt notified he will have BP and weightready when we call for previsit call

## 2019-06-24 NOTE — Patient Instructions (Addendum)
Medication Instructions:  Your physician recommends that you continue on your current medications as directed. Please refer to the Current Medication list given to you today.  *If you need a refill on your cardiac medications before your next appointment, please call your pharmacy*  Lab Work: NONE If you have labs (blood work) drawn today and your tests are completely normal, you will receive your results only by: Marland Kitchen MyChart Message (if you have MyChart) OR . A paper copy in the mail If you have any lab test that is abnormal or we need to change your treatment, we will call you to review the results.  Testing/Procedures: Your physician has requested that you have a carotid duplex. This test is an ultrasound of the carotid arteries in your neck. It looks at blood flow through these arteries that supply the brain with blood. Allow one hour for this exam. There are no restrictions or special instructions.  DUE September 2021  Follow-Up: At [facility], you and your health needs are our priority.  As part of our continuing mission to provide you with exceptional heart care, we have created designated Provider Care Teams.  These Care Teams include your primary Cardiologist (physician) and Advanced Practice Providers (APPs -  Physician Assistants and Nurse Practitioners) who all work together to provide you with the care you need, when you need it.  Your next appointment:   12 month(s)  The format for your next appointment:   Either In Person or Virtual  Provider:   DR. Percival Spanish

## 2019-06-24 NOTE — Addendum Note (Signed)
Addended by: Annita Brod on: 06/24/2019 01:03 PM   Modules accepted: Orders

## 2019-07-11 ENCOUNTER — Encounter (HOSPITAL_COMMUNITY): Payer: Medicare Other

## 2019-08-04 ENCOUNTER — Ambulatory Visit: Payer: Medicare Other | Attending: Internal Medicine

## 2019-08-04 DIAGNOSIS — Z23 Encounter for immunization: Secondary | ICD-10-CM | POA: Insufficient documentation

## 2019-08-04 NOTE — Progress Notes (Signed)
   Covid-19 Vaccination Clinic  Name:  Sean Burns    MRN: [id number] DOB: 1935-01-08  08/04/2019  Mr. Beazley was observed post Covid-19 immunization for 15 minutes without incidence. He was provided with Vaccine Information Sheet and instruction to access the V-Safe system.   Mr. Gilliand was instructed to call 911 with any severe reactions post vaccine: Marland Kitchen Difficulty breathing  . Swelling of your face and throat  . A fast heartbeat  . A bad rash all over your body  . Dizziness and weakness    Immunizations Administered    Name Date Dose VIS Date Route   Pfizer COVID-19 Vaccine 08/04/2019  9:23 AM 0.3 mL 06/24/2019 Intramuscular   Manufacturer: [employer], [employer]   Lot: [id number]   NDC: [address]-0

## 2019-08-25 ENCOUNTER — Ambulatory Visit: Payer: Medicare Other | Attending: Internal Medicine

## 2019-08-25 DIAGNOSIS — Z23 Encounter for immunization: Secondary | ICD-10-CM | POA: Insufficient documentation

## 2019-08-25 NOTE — Progress Notes (Signed)
   Covid-19 Vaccination Clinic  Name:  Sean Burns    MRN: [id number] DOB: 29-Jul-1934  08/25/2019  [name] was observed post Covid-19 immunization for 15 minutes without incidence. He was provided with Vaccine Information Sheet and instruction to access the V-Safe system.   [name] was instructed to call 911 with any severe reactions post vaccine: Marland Kitchen Difficulty breathing  . Swelling of your face and throat  . A fast heartbeat  . A bad rash all over your body  . Dizziness and weakness    Immunizations Administered    Name Date Dose VIS Date Route   Pfizer COVID-19 Vaccine 08/25/2019  8:37 AM 0.3 mL 06/24/2019 Intramuscular   Manufacturer: [employer], [employer]   Lot: [id number]   NDC: [address]-6

## 2020-05-01 ENCOUNTER — Other Ambulatory Visit: Payer: Self-pay

## 2020-05-01 ENCOUNTER — Ambulatory Visit (HOSPITAL_COMMUNITY)
Admission: RE | Admit: 2020-05-01 | Discharge: 2020-05-01 | Disposition: A | Discharge status: Home / Self Care | Payer: Medicare Other | Source: Ambulatory Visit | Attending: Cardiovascular Disease | Admitting: Cardiovascular Disease

## 2020-05-01 DIAGNOSIS — I6523 Occlusion and stenosis of bilateral carotid arteries: Secondary | ICD-10-CM | POA: Diagnosis not present

## 2020-05-01 DIAGNOSIS — I6529 Occlusion and stenosis of unspecified carotid artery: Secondary | ICD-10-CM | POA: Diagnosis not present

## 2022-07-11 ENCOUNTER — Encounter: Payer: Self-pay | Admitting: *Deleted

## 2022-07-11 ENCOUNTER — Other Ambulatory Visit: Payer: Self-pay | Admitting: *Deleted

## 2022-07-11 ENCOUNTER — Other Ambulatory Visit (INDEPENDENT_AMBULATORY_CARE_PROVIDER_SITE_OTHER): Payer: Medicare Other

## 2022-07-11 DIAGNOSIS — R55 Syncope and collapse: Secondary | ICD-10-CM

## 2022-07-11 NOTE — Progress Notes (Unsigned)
Enrolled for [employer] to mail a ZIO AT Live Telemetry monitor to patients address on file.   DOD to read.  Fax results to Dr. Mosetta Putt at [phone].

## 2022-07-24 DIAGNOSIS — R55 Syncope and collapse: Secondary | ICD-10-CM

## 2022-08-19 ENCOUNTER — Ambulatory Visit: Payer: Medicare Other | Admitting: Podiatry

## 2022-08-19 ENCOUNTER — Ambulatory Visit (INDEPENDENT_AMBULATORY_CARE_PROVIDER_SITE_OTHER): Payer: Medicare Other | Admitting: Podiatry

## 2022-08-19 DIAGNOSIS — M79674 Pain in right toe(s): Secondary | ICD-10-CM

## 2022-08-19 DIAGNOSIS — B351 Tinea unguium: Secondary | ICD-10-CM

## 2022-08-19 DIAGNOSIS — M79675 Pain in left toe(s): Secondary | ICD-10-CM

## 2022-08-19 NOTE — Progress Notes (Signed)
Subjective:   Patient ID: Sean Burns, male   DOB: 88 y.o.   MRN: [id number]   HPI Chief Complaint  Patient presents with   nail care    Bilateral nail thick, trim    88 year old male presents the office with above concerns.  States the nails are very thickened elongated and not able to trim them himself and are causing pain.  No swelling redness or drainage.  No open lesions.   Review of Systems  All other systems reviewed and are negative.  Past Medical History:  Diagnosis Date   Anxiety    BPH (benign prostatic hypertrophy)    Carotid stenosis    Mild   Change in voice    Clostridium difficile infection    Depression    GERD (gastroesophageal reflux disease)    Hyperlipidemia    Hypothyroidism    Insomnia due to medical condition 07/31/2014   Loss of appetite     Past Surgical History:  Procedure Laterality Date   CATARACT EXTRACTION Bilateral 2008   EYE SURGERY  2009   both eyes; laser for eye pressure   TONSILLECTOMY  1942   torn retina Left 2008   left   WISDOM TOOTH EXTRACTION  2011     Current Outpatient Medications:    Cholecalciferol (VITAMIN D-3 PO), Take 4,000 Units by mouth daily. , Disp: , Rfl:    finasteride (PROSCAR) 5 MG tablet, Take 5 mg by mouth at bedtime., Disp: , Rfl: 1   mirtazapine (REMERON) 30 MG tablet, Take 30 mg by mouth at bedtime., Disp: , Rfl: 2   rosuvastatin (CRESTOR) 10 MG tablet, Take 10 mg by mouth at bedtime. , Disp: , Rfl: 2   SYNTHROID 50 MCG tablet, Take 50 mcg by mouth daily., Disp: , Rfl: 3  Allergies  Allergen Reactions   Feldene [Piroxicam] Hives          Objective:  Physical Exam  General: NAD  Dermatological: Nails are extremely elongated, hypertrophic, dystrophic with yellow discoloration of the nails 1-5 bilaterally causing pain.  There is no edema, erythema or signs of infection.  Vascular: Dorsalis Pedis artery and Posterior Tibial artery pedal pulses are palpable bilateral with immedate capillary fill  time. There is no pain with calf compression, swelling, warmth, erythema.   Neruologic: Grossly intact via light touch bilateral.   Musculoskeletal: Aside from the toenails no other areas of discomfort.     Assessment:   Symptomatic onychomycosis     Plan:  -Treatment options discussed including all alternatives, risks, and complications -Etiology of symptoms were discussed -Nails debrided 10 without complications or bleeding. -Daily foot inspection -Follow-up in 3 months or sooner if any problems arise. In the meantime, encouraged to call the office with any questions, concerns, change in symptoms.   Celesta Gentile, DPM

## 2022-11-19 ENCOUNTER — Ambulatory Visit: Payer: Medicare Other | Admitting: Podiatry

## 2023-02-17 ENCOUNTER — Ambulatory Visit: Payer: Medicare Other | Admitting: Podiatry

## 2023-04-15 ENCOUNTER — Ambulatory Visit: Payer: Medicare Other | Admitting: Podiatry

## 2023-05-25 ENCOUNTER — Encounter: Payer: Self-pay | Admitting: Podiatry

## 2023-05-25 ENCOUNTER — Ambulatory Visit (INDEPENDENT_AMBULATORY_CARE_PROVIDER_SITE_OTHER): Payer: Medicare Other | Admitting: Podiatry

## 2023-05-25 DIAGNOSIS — B351 Tinea unguium: Secondary | ICD-10-CM | POA: Diagnosis not present

## 2023-05-25 DIAGNOSIS — M79675 Pain in left toe(s): Secondary | ICD-10-CM | POA: Insufficient documentation

## 2023-05-25 DIAGNOSIS — M79674 Pain in right toe(s): Secondary | ICD-10-CM

## 2023-05-25 NOTE — Progress Notes (Signed)
This patient presents to the office with chief complaint of long thick painful nails.  Patient says the nails are painful walking and wearing shoes.  This patient is unable to self treat.  This patient is unable to trim his nails since he is unable to reach his nails.  he presents to the office for preventative foot care services.  General Appearance  Alert, conversant and in no acute stress.  Vascular  Dorsalis pedis and posterior tibial  pulses are  weakly palpable  bilaterally.  Capillary return is within normal limits  bilaterally. Temperature is within normal limits  bilaterally.  Neurologic  Senn-Weinstein monofilament wire test within normal limits  bilaterally. Muscle power within normal limits bilaterally.  Nails Thick disfigured discolored nails with subungual debris  from hallux to fifth toes bilaterally. No evidence of bacterial infection or drainage bilaterally.  Orthopedic  No limitations of motion  feet .  No crepitus or effusions noted.  No bony pathology or digital deformities noted.  Skin  normotropic skin with no porokeratosis noted bilaterally.  No signs of infections or ulcers noted.     Onychomycosis  Nails  B/L.  Pain in right toes  Pain in left toes  Debridement of nails both feet followed trimming the nails with dremel tool.    RTC 3 months.   Helane Gunther DPM

## 2023-08-26 ENCOUNTER — Ambulatory Visit: Payer: Medicare Other | Admitting: Podiatry

## 2023-09-25 ENCOUNTER — Ambulatory Visit: Payer: Medicare Other | Admitting: Podiatry

## 2023-11-06 ENCOUNTER — Encounter: Payer: Self-pay | Admitting: Podiatry

## 2023-11-06 ENCOUNTER — Ambulatory Visit (INDEPENDENT_AMBULATORY_CARE_PROVIDER_SITE_OTHER): Payer: PRIVATE HEALTH INSURANCE | Admitting: Podiatry

## 2023-11-06 VITALS — Ht 69.0 in | Wt 142.0 lb

## 2023-11-06 DIAGNOSIS — B351 Tinea unguium: Secondary | ICD-10-CM

## 2023-11-06 DIAGNOSIS — M79675 Pain in left toe(s): Secondary | ICD-10-CM | POA: Diagnosis not present

## 2023-11-06 DIAGNOSIS — M79674 Pain in right toe(s): Secondary | ICD-10-CM

## 2023-11-06 NOTE — Progress Notes (Signed)
This patient presents to the office with chief complaint of long thick painful nails.  Patient says the nails are painful walking and wearing shoes.  This patient is unable to self treat.  This patient is unable to trim his nails since he is unable to reach his nails.  he presents to the office for preventative foot care services.  General Appearance  Alert, conversant and in no acute stress.  Vascular  Dorsalis pedis and posterior tibial  pulses are  weakly palpable  bilaterally.  Capillary return is within normal limits  bilaterally. Temperature is within normal limits  bilaterally.  Neurologic  Senn-Weinstein monofilament wire test within normal limits  bilaterally. Muscle power within normal limits bilaterally.  Nails Thick disfigured discolored nails with subungual debris  from hallux to fifth toes bilaterally. No evidence of bacterial infection or drainage bilaterally.  Orthopedic  No limitations of motion  feet .  No crepitus or effusions noted.  No bony pathology or digital deformities noted.  Skin  normotropic skin with no porokeratosis noted bilaterally.  No signs of infections or ulcers noted.     Onychomycosis  Nails  B/L.  Pain in right toes  Pain in left toes  Debridement of nails both feet followed trimming the nails with dremel tool.    RTC 3 months.   Helane Gunther DPM

## 2024-02-05 ENCOUNTER — Ambulatory Visit: Admitting: Podiatry

## 2024-04-04 ENCOUNTER — Ambulatory Visit: Admitting: Podiatry

## 2024-05-18 ENCOUNTER — Ambulatory Visit: Admitting: Podiatry

## 2024-06-17 ENCOUNTER — Ambulatory Visit: Admitting: Podiatry

## 2024-08-10 ENCOUNTER — Ambulatory Visit: Admitting: Podiatry

## 2024-08-23 ENCOUNTER — Ambulatory Visit: Admitting: Podiatry
